# Patient Record
Sex: Male | Born: 1950 | Race: White | Hispanic: No | Marital: Married | Smoking: Never smoker
Health system: Southern US, Community
[De-identification: ages and names within clinical notes are randomized; demographics above are authoritative.]

## PROBLEM LIST (undated history)

## (undated) DIAGNOSIS — E785 Hyperlipidemia, unspecified: Secondary | ICD-10-CM

## (undated) DIAGNOSIS — Q2381 Bicuspid aortic valve: Secondary | ICD-10-CM

## (undated) DIAGNOSIS — I10 Essential (primary) hypertension: Secondary | ICD-10-CM

## (undated) DIAGNOSIS — Z953 Presence of xenogenic heart valve: Secondary | ICD-10-CM

## (undated) DIAGNOSIS — R319 Hematuria, unspecified: Secondary | ICD-10-CM

## (undated) DIAGNOSIS — I350 Nonrheumatic aortic (valve) stenosis: Principal | ICD-10-CM

## (undated) DIAGNOSIS — Q231 Congenital insufficiency of aortic valve: Secondary | ICD-10-CM

## (undated) DIAGNOSIS — G43909 Migraine, unspecified, not intractable, without status migrainosus: Secondary | ICD-10-CM

## (undated) HISTORY — PX: CLOSED MANIPULATION SHOULDER: SUR205

## (undated) HISTORY — PX: CARDIAC CATHETERIZATION: SHX172

## (undated) HISTORY — DX: Nonrheumatic aortic (valve) stenosis: I35.0

## (undated) HISTORY — PX: HIP ARTHROSCOPY: SUR88

## (undated) HISTORY — PX: CYSTOSCOPY: SUR368

## (undated) HISTORY — DX: Bicuspid aortic valve: Q23.81

## (undated) HISTORY — DX: Hyperlipidemia, unspecified: E78.5

## (undated) HISTORY — DX: Congenital insufficiency of aortic valve: Q23.1

## (undated) HISTORY — DX: Essential (primary) hypertension: I10

---

## 2005-10-21 ENCOUNTER — Ambulatory Visit (HOSPITAL_COMMUNITY): Admission: RE | Admit: 2005-10-21 | Discharge: 2005-10-21 | Payer: Self-pay | Admitting: Family Medicine

## 2007-01-18 ENCOUNTER — Ambulatory Visit: Payer: Self-pay

## 2007-01-18 ENCOUNTER — Encounter: Payer: Self-pay | Admitting: Family Medicine

## 2007-05-04 ENCOUNTER — Ambulatory Visit: Payer: Self-pay | Admitting: Cardiology

## 2009-02-11 ENCOUNTER — Telehealth (INDEPENDENT_AMBULATORY_CARE_PROVIDER_SITE_OTHER): Payer: Self-pay | Admitting: *Deleted

## 2009-02-12 ENCOUNTER — Ambulatory Visit (HOSPITAL_COMMUNITY): Admission: RE | Admit: 2009-02-12 | Discharge: 2009-02-12 | Payer: Self-pay | Admitting: Orthopedic Surgery

## 2009-02-13 ENCOUNTER — Encounter: Admission: RE | Admit: 2009-02-13 | Discharge: 2009-03-14 | Payer: Self-pay | Admitting: Orthopedic Surgery

## 2009-07-22 ENCOUNTER — Encounter: Admission: RE | Admit: 2009-07-22 | Discharge: 2009-07-22 | Payer: Self-pay | Admitting: Family Medicine

## 2009-11-13 ENCOUNTER — Ambulatory Visit: Payer: Self-pay | Admitting: Cardiology

## 2009-11-13 DIAGNOSIS — I359 Nonrheumatic aortic valve disorder, unspecified: Secondary | ICD-10-CM | POA: Insufficient documentation

## 2009-11-13 DIAGNOSIS — E785 Hyperlipidemia, unspecified: Secondary | ICD-10-CM | POA: Insufficient documentation

## 2009-11-13 DIAGNOSIS — I10 Essential (primary) hypertension: Secondary | ICD-10-CM | POA: Insufficient documentation

## 2009-12-10 ENCOUNTER — Ambulatory Visit (HOSPITAL_COMMUNITY): Admission: RE | Admit: 2009-12-10 | Discharge: 2009-12-10 | Payer: Self-pay | Admitting: Gastroenterology

## 2010-01-14 ENCOUNTER — Encounter: Payer: Self-pay | Admitting: Cardiology

## 2010-01-14 ENCOUNTER — Ambulatory Visit: Payer: Self-pay | Admitting: Cardiology

## 2010-01-14 ENCOUNTER — Ambulatory Visit (HOSPITAL_COMMUNITY): Admission: RE | Admit: 2010-01-14 | Discharge: 2010-01-14 | Payer: Self-pay | Admitting: Cardiology

## 2010-01-23 ENCOUNTER — Telehealth (INDEPENDENT_AMBULATORY_CARE_PROVIDER_SITE_OTHER): Payer: Self-pay | Admitting: *Deleted

## 2010-06-30 ENCOUNTER — Emergency Department (HOSPITAL_COMMUNITY)
Admission: EM | Admit: 2010-06-30 | Discharge: 2010-07-01 | Payer: Self-pay | Source: Home / Self Care | Admitting: Emergency Medicine

## 2010-07-02 LAB — URINALYSIS, ROUTINE W REFLEX MICROSCOPIC
Bilirubin Urine: NEGATIVE
Ketones, ur: NEGATIVE mg/dL
Leukocytes, UA: NEGATIVE
Nitrite: NEGATIVE
Protein, ur: NEGATIVE mg/dL
Specific Gravity, Urine: 1.014 (ref 1.005–1.030)
Urine Glucose, Fasting: NEGATIVE mg/dL
Urobilinogen, UA: 1 mg/dL (ref 0.0–1.0)
pH: 7.5 (ref 5.0–8.0)

## 2010-07-02 LAB — POCT I-STAT, CHEM 8
BUN: 25 mg/dL — ABNORMAL HIGH (ref 6–23)
Calcium, Ion: 1.22 mmol/L (ref 1.12–1.32)
Chloride: 102 mEq/L (ref 96–112)
Creatinine, Ser: 1.1 mg/dL (ref 0.4–1.5)
Glucose, Bld: 79 mg/dL (ref 70–99)
HCT: 47 % (ref 39.0–52.0)
Hemoglobin: 16 g/dL (ref 13.0–17.0)
Potassium: 4.2 mEq/L (ref 3.5–5.1)
Sodium: 140 mEq/L (ref 135–145)
TCO2: 32 mmol/L (ref 0–100)

## 2010-07-02 LAB — DIFFERENTIAL
Basophils Absolute: 0.1 10*3/uL (ref 0.0–0.1)
Basophils Relative: 1 % (ref 0–1)
Eosinophils Absolute: 0.2 10*3/uL (ref 0.0–0.7)
Eosinophils Relative: 1 % (ref 0–5)
Lymphocytes Relative: 30 % (ref 12–46)
Lymphs Abs: 3.2 10*3/uL (ref 0.7–4.0)
Monocytes Absolute: 0.8 10*3/uL (ref 0.1–1.0)
Monocytes Relative: 8 % (ref 3–12)
Neutro Abs: 6.4 10*3/uL (ref 1.7–7.7)
Neutrophils Relative %: 60 % (ref 43–77)

## 2010-07-02 LAB — CBC
HCT: 43.7 % (ref 39.0–52.0)
Hemoglobin: 15.1 g/dL (ref 13.0–17.0)
MCH: 31.6 pg (ref 26.0–34.0)
MCHC: 34.6 g/dL (ref 30.0–36.0)
MCV: 91.4 fL (ref 78.0–100.0)
Platelets: 209 10*3/uL (ref 150–400)
RBC: 4.78 MIL/uL (ref 4.22–5.81)
RDW: 12.4 % (ref 11.5–15.5)
WBC: 10.6 10*3/uL — ABNORMAL HIGH (ref 4.0–10.5)

## 2010-07-02 LAB — LIPASE, BLOOD: Lipase: 43 U/L (ref 11–59)

## 2010-07-02 LAB — URINE MICROSCOPIC-ADD ON

## 2010-07-02 LAB — HEPATIC FUNCTION PANEL
ALT: 29 U/L (ref 0–53)
AST: 40 U/L — ABNORMAL HIGH (ref 0–37)
Albumin: 3.9 g/dL (ref 3.5–5.2)
Alkaline Phosphatase: 71 U/L (ref 39–117)
Bilirubin, Direct: 0.4 mg/dL — ABNORMAL HIGH (ref 0.0–0.3)
Indirect Bilirubin: 0.3 mg/dL (ref 0.3–0.9)
Total Bilirubin: 0.7 mg/dL (ref 0.3–1.2)
Total Protein: 6.9 g/dL (ref 6.0–8.3)

## 2010-07-15 NOTE — Assessment & Plan Note (Signed)
Summary: [facility]   Visit Type:  Follow-up Primary Provider:  Dr. Vernon Prey  CC:  Aortic Stenosis.  History of Present Illness: The patient presents for follow up of his known aortic stenosis. He has a functionally bicuspid aortic valve with an echocardiogram in 2008. At that time when I last saw him he was asymptomatic. He remains asymptomatic. He exercises including bicycling. With this activity he has not had any chest pressure, neck or arm discomfort. He does not have palpitations, presyncope or syncope. He does not have shortness of breath and denies any PND or orthopnea. He has had no swelling or weight gain.  Current Medications (verified): 1)  Lisinopril 5 Mg Tabs (Lisinopril) .Marland Kitchen.. 1 By Mouth Daily 2)  Flonase 50 Mcg/act Susp (Fluticasone Propionate) .... As Directed 3)  Fish Oil   Oil (Fish Oil) .... 4 By Mouth Daily 4)  Vitamin D 1000 Unit Tabs (Cholecalciferol) .... 2 By Mouth Daily 5)  Multivitamins   Tabs (Multiple Vitamin) .Marland Kitchen.. 1 By Mouth Daily  Allergies (verified): No Known Drug Allergies  Past History:  Past Medical History: 1. Hypertension x16 years. 2. Hyperlipidemia. 3. Bicuspid aortic valve.   Past Surgical History: Reviewed history from 11/13/2009 and no changes required. Arthroscopic hip surgery.   Review of Systems       As stated in the HPI and negative for all other systems.   Vital Signs:  Patient profile:   60 year old male Height:      70 inches Weight:      157 pounds BMI:     22.61 Pulse rate:   65 / minute Resp:     16 per minute BP sitting:   104 / 66  (right arm)  Vitals Entered By: Marrion Coy, CNA (November 13, 2009 2:20 PM)  Physical Exam  General:  Well developed, well nourished, in no acute distress. Head:  normocephalic and atraumatic Neck:  Neck supple, no JVD. No masses, thyromegaly or abnormal cervical nodes, transmitted systolic murmur Chest Wall:  no deformities or breast masses noted Lungs:  Clear bilaterally  to auscultation and percussion. Abdomen:  Bowel sounds positive; abdomen soft and non-tender without masses, organomegaly, or hernias noted. No hepatosplenomegaly. Msk:  Back normal, normal gait. Muscle strength and tone normal. Extremities:  No clubbing or cyanosis. Neurologic:  Alert and oriented x 3. Skin:  Intact without lesions or rashes. Cervical Nodes:  no significant adenopathy Inguinal Nodes:  no significant adenopathy Psych:  Normal affect.   Detailed Cardiovascular Exam  Neck    Carotids: Carotids full and equal bilaterally without bruits.      Neck Veins: Normal, no JVD.    Heart    Inspection: no deformities or lifts noted.      Palpation: normal PMI with no thrills palpable.      Auscultation: S1 and S2 within normal limits, no S3, no S4, no clicks, no rubs, 3/6 mid peaking apical systolic murmur radiating up the aortic outflow tract and into the carotids, no diastolic murmurs.  Vascular    Abdominal Aorta: no palpable masses, pulsations, or audible bruits.      Femoral Pulses: normal femoral pulses bilaterally.      Pedal Pulses: normal pedal pulses bilaterally.      Radial Pulses: normal radial pulses bilaterally.      Peripheral Circulation: no clubbing, cyanosis, or edema noted with normal capillary refill.     EKG  Procedure date:  11/13/2009  Findings:  sinus rhythm, rate 68, axis within normal limits, intervals within normal limits, no acute ST-T wave changes.  Impression & Recommendations:  Problem # 1:  AORTIC VALVE DISEASE (ICD-424.1) I suspect his aortic stenosis is moderate and perhaps slightly progressed. He will get another echocardiogram in August which will be 3 years from the time of his last.  He does remain asymptomatic. Orders: Echocardiogram (Echo) EKG w/ Interpretation (93000)  Problem # 2:  HYPERTENSION (ICD-401.9) Hicatheters blood pressure is well controlled. We will continue on the meds as listed. Orders: Echocardiogram  (Echo) EKG w/ Interpretation (93000)  Problem # 3:  HYPERLIPIDEMIA (ICD-272.4) The patient has had trouble tolerating statins. He is to start trilipix.  I will defer to his primary MD.  Patient Instructions: 1)  Your physician recommends that you schedule a follow-up appointment in: 2 year with Dr [name] 2)  Your physician recommends that you continue on your current medications as directed. Please refer to the Current Medication list given to you today. 3)  Your physician has requested that you have an echocardiogram.  Echocardiography is a painless test that uses sound waves to create images of your heart. It provides your doctor with information about the size and shape of your heart and how well your heart's chambers and valves are working.  This procedure takes approximately one hour. There are no restrictions for this procedure.

## 2010-07-15 NOTE — Progress Notes (Signed)
Summary: Patient concern about staff wearing perfume  Phone Note Call from Patient Call back at Home Phone [phone]   Caller: Patient Summary of Call: While John Boyd echo results were being conveyed to him, he expressed concern about the fact that the staff member who registered him was wearing perfume.  He said that it made him ill for the rest of the day after his echo.  I spoke with the patient and made him aware that our policy oes prohibit staff from wearing perfumes and that the staff would be reminded of the policy.  He voiced appreciation. Initial call taken by: Fabio Neighbors, Site Manager

## 2010-09-20 LAB — COMPREHENSIVE METABOLIC PANEL
ALT: 116 U/L — ABNORMAL HIGH (ref 0–53)
AST: 70 U/L — ABNORMAL HIGH (ref 0–37)
Albumin: 4.2 g/dL (ref 3.5–5.2)
Alkaline Phosphatase: 71 U/L (ref 39–117)
BUN: 14 mg/dL (ref 6–23)
CO2: 28 mEq/L (ref 19–32)
Calcium: 9.8 mg/dL (ref 8.4–10.5)
Chloride: 104 mEq/L (ref 96–112)
Creatinine, Ser: 0.89 mg/dL (ref 0.4–1.5)
GFR calc Af Amer: >60 mL/min (ref >60)
GFR calc non Af Amer: >60 mL/min (ref >60)
Glucose, Bld: 90 mg/dL (ref 70–99)
Potassium: 4.5 mEq/L (ref 3.5–5.1)
Sodium: 140 mEq/L (ref 135–145)
Total Bilirubin: 0.5 mg/dL (ref 0.3–1.2)
Total Protein: 7.2 g/dL (ref 6.0–8.3)

## 2010-09-20 LAB — CBC
HCT: 43.7 % (ref 39.0–52.0)
Hemoglobin: 15.3 g/dL (ref 13.0–17.0)
MCHC: 34.9 g/dL (ref 30.0–36.0)
MCV: 93.9 fL (ref 78.0–100.0)
Platelets: 204 10*3/uL (ref 150–400)
RBC: 4.66 MIL/uL (ref 4.22–5.81)
RDW: 12.7 % (ref 11.5–15.5)
WBC: 7.6 10*3/uL (ref 4.0–10.5)

## 2010-09-20 LAB — URINALYSIS, ROUTINE W REFLEX MICROSCOPIC
Bilirubin Urine: NEGATIVE
Glucose, UA: NEGATIVE mg/dL
Ketones, ur: NEGATIVE mg/dL
Leukocytes, UA: NEGATIVE
Nitrite: NEGATIVE
Protein, ur: NEGATIVE mg/dL
Specific Gravity, Urine: 1.005 (ref 1.005–1.030)
Urobilinogen, UA: 0.2 mg/dL (ref 0.0–1.0)
pH: 6.5 (ref 5.0–8.0)

## 2010-09-20 LAB — URINE MICROSCOPIC-ADD ON

## 2010-09-20 LAB — DIFFERENTIAL
Basophils Absolute: 0 10*3/uL (ref 0.0–0.1)
Basophils Relative: 1 % (ref 0–1)
Eosinophils Absolute: 0.2 10*3/uL (ref 0.0–0.7)
Eosinophils Relative: 2 % (ref 0–5)
Lymphocytes Relative: 34 % (ref 12–46)
Lymphs Abs: 2.6 10*3/uL (ref 0.7–4.0)
Monocytes Absolute: 0.6 10*3/uL (ref 0.1–1.0)
Monocytes Relative: 9 % (ref 3–12)
Neutro Abs: 4.2 10*3/uL (ref 1.7–7.7)
Neutrophils Relative %: 55 % (ref 43–77)

## 2010-09-20 LAB — APTT: aPTT: 29 seconds (ref 24–37)

## 2010-09-20 LAB — PROTIME-INR
INR: 1 (ref 0.00–1.49)
Prothrombin Time: 12.6 seconds (ref 11.6–15.2)

## 2010-10-28 NOTE — Op Note (Signed)
NAMEWILLIAMS, John Boyd              ACCOUNT NO.:  [id number]   MEDICAL RECORD NO.:  [id number]          PATIENT TYPE:  AMB   LOCATION:  DAY                          FACILITY:  [facility]   PHYSICIAN:  Ronald A. Gioffre, M.D.DATE OF BIRTH:  1950-10-09   DATE OF PROCEDURE:  02/12/2009  DATE OF DISCHARGE:                               OPERATIVE REPORT   SURGEON:  Georges Lynch. Gioffre, M.D.   ASSISTANT:  None.   PREOPERATIVE DIAGNOSES:  1. Severe frozen shoulder, right shoulder.  2. Subdeltoid bursitis, right shoulder.   POSTOPERATIVE DIAGNOSES:  1. Severe frozen shoulder, right shoulder.  2. Subdeltoid bursitis, right shoulder.   OPERATION:  1. Closed manipulation of the right shoulder.  2. Injection right subdeltoid bursa with 1 mL Depo-Medrol, 5 mL of      0.5% Marcaine.   DESCRIPTION OF PROCEDURE:  Under general anesthesia routine prep of the  shoulder was carried out and did a closed manipulation and gently this  manipulation was carried out in several different phase.  This was a  gentle closed manipulation.  I was able to easily get his arm above his  head and corrected most of his external rotation, internal rotation.  I  then did the prep and injected the shoulder with 5 mL of 0.5% Marcaine  with 1 mL Depo-Medrol.  The patient will be started on therapy the  following day.           ______________________________  Georges Lynch. Darrelyn Hillock, M.D.     RAG/MEDQ  D:  02/12/2009  T:  02/12/2009  Job:  [id number]

## 2010-10-28 NOTE — Assessment & Plan Note (Signed)
[facility]                            CARDIOLOGY OFFICE NOTE   NAME:John Boyd, John Boyd                     MRN:          [id number]  DATE:05/04/2007                            DOB:          11/14/1950    REASON FOR CONSULTATION:  Evaluate patient with an abnormal exercise  treadmill test and bicuspid aortic valve.   HISTORY OF PRESENT ILLNESS:  The patient is a pleasant 60 year old  gentleman who was told in the 1990s that he probably had a bicuspid  aortic valve.  He has had followup echocardiograms every 4 or 5 years.  The last one was in our office in August of 2008.  At that time, he had  an ejection fraction of 60% to 65%.  The estimated aortic valve area is  1.21.  The mean gradient was 12.  This was mild aortic valve stenosis  with trivial aortic regurgitation.  It appeared to be a bicuspid valve.  He most recently had an exercise treadmill test.  He had an appropriate  blood pressure and heart rate response.  There was some ST segment  depression and peak exercise; however, I do not see this consistently in  consecutive leads from beat to beat.  There is some artifact.  He did  not have any symptoms of chest discomfort.  There were no arrhythmias.   The patient is quite active.  He exercises routinely.  He denies any  chest discomfort, neck discomfort, arm discomfort, activity-induced  nausea or vomiting, excessive diaphoresis.  He does not have any  palpitations, presyncope or syncope.  He has had no PND or orthopnea.   PAST MEDICAL HISTORY:  1. Hypertension x14 years.  2. Hyperlipidemia.  3. Bicuspid aortic valve.   PAST SURGICAL HISTORY:  Arthroscopic hip surgery.   ALLERGIES:  None.   MEDICATIONS:  1. Lisinopril 10 mg daily.  2. Nasal spray.  3. Niaspan 500 mg daily.  4. Crestor 10 mg a day.  5. Aspirin.  6. Multivitamin.  7. Loratadine.  8. Mucinex.   SOCIAL HISTORY:  The patient is a Optician, dispensing.  He is married.  He has 2  children.  He never smoked cigarettes and does not drink alcohol.   FAMILY HISTORY:  Noncontributory for early coronary artery disease,  congenital heart disease, sudden death.   REVIEW OF SYSTEMS:  As stated in the HPI and otherwise positive for  reflux, negative for other systems.   PHYSICAL EXAMINATION:  The patient is in no distress.  Blood pressure 116/78, heart rate 61 and regular.  HEENT:  Eyelids unremarkable, pupils are equal, round, and reactive to  light, fundi not visualized.  Oral mucosa unremarkable.  NECK:  No jugular venous distension at 45 degrees, carotid upstroke  brisk and symmetrical.  No bruit.  No thyromegaly.  LYMPHATICS:  No cervical, axillary, inguinal adenopathy.  LUNGS:  Clear to auscultation bilaterally.  BACK:  No costovertebral angle tenderness.  CHEST:  Unremarkable.  HEART:  PMI not displaced or sustained.  S1 and S2 are within normal  limits.  No S3, no S4.  A 3/6 apical systolic  murmur radiating at the  aortic outflow track.  No diastolic murmurs.  ABDOMEN:  Flat, positive bowel sounds, normal in frequency and pitch.  No bruits, no rebound, no guarding.  No midline pulsatile mass.  No  hepatomegaly, no splenomegaly.  SKIN:  No rashes, no nodules.  EXTREMITIES:  2+ pulses throughout, no edema.  No cyanosis, no clubbing.  NEURO:  Oriented to person, place, and time.  Cranial nerves II-XII  grossly intact, motor grossly intact.   EKG sinus rhythm, rate 61, axis within normal limits, intervals within  normal limits.  No acute ST-T wave changes.   ASSESSMENT AND PLAN:  1. Abnormal exercise treadmill test:  I did review this in detail.      The patient has no symptoms.  He is quite active.  He has some risk      factors though they are well controlled.  There is some, what I      believe to be, relatively nonspecific findings on the treadmill      with an appropriate heart rate and blood pressure response.      Therefore, I would not interpret this  as high probability for      coronary disease, and would not suggest further cardiovascular      testing.  He should continue with primary risk reduction.  2. Bicuspid aortic valve.  I did review this, reviewing a heart model      with him in detail.  I think he ought to be followed every couple      of years in this clinic.  It is mild currently and he is not having      any symptoms.  I did tell him that new guidelines do not suggest      that he needs endocarditis prophylaxis.  3. Hypertension:  Blood pressure is well controlled and he will      continue medications as listed.  4. Dyslipidemia:  Per Dr. Christell Constant, and he is having aggressive      management.  5. Followup:  I will see him back in 24 months or sooner if needed.     [name], MD, [facility]  Electronically Signed    JH/MedQ  DD: 05/04/2007  DT: 05/04/2007  Job #: [id number]   cc:   Ernestina Penna, M.D.

## 2011-01-20 ENCOUNTER — Encounter: Payer: Self-pay | Admitting: Cardiology

## 2012-03-09 ENCOUNTER — Telehealth: Payer: Self-pay | Admitting: Cardiology

## 2012-03-09 DIAGNOSIS — I359 Nonrheumatic aortic valve disorder, unspecified: Secondary | ICD-10-CM

## 2012-03-09 NOTE — Telephone Encounter (Signed)
Will forward to DrHochrein for review and recommendations for letter.  Pt has not been seen in office, only in the hosp 8/11.

## 2012-03-09 NOTE — Telephone Encounter (Signed)
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 AttGreggory Keen [employer] [phone], pls call [phone] and mail copy to pt

## 2012-03-10 NOTE — Telephone Encounter (Signed)
He needs to be seen back in the office with an echo before we can right any letter.  He could have the echo same day.

## 2012-03-11 ENCOUNTER — Telehealth: Payer: Self-pay | Admitting: *Deleted

## 2012-03-11 NOTE — Telephone Encounter (Signed)
Left message for Mr. Defelice to call and schedule echo and office.

## 2012-03-14 NOTE — Telephone Encounter (Signed)
Spoke with Mr. Mixell today. He has decided not schedule Echo/OV with Dr. [name] at this time. Patient has moved to [facility], and has decided to contact his PCP to see if he will write him a letter. Mr. Mcright stated he may have to call back if his PCP decides he needs to see his Cardiologist. He will call us back if anything changes. I will defer the Echocardiogram order until we hear from Mr. You.

## 2012-03-18 ENCOUNTER — Other Ambulatory Visit (HOSPITAL_COMMUNITY): Payer: Self-pay

## 2012-03-18 ENCOUNTER — Ambulatory Visit (HOSPITAL_COMMUNITY): Payer: BC Managed Care – PPO | Attending: Cardiology

## 2012-03-18 DIAGNOSIS — I080 Rheumatic disorders of both mitral and aortic valves: Secondary | ICD-10-CM | POA: Insufficient documentation

## 2012-03-18 DIAGNOSIS — I369 Nonrheumatic tricuspid valve disorder, unspecified: Secondary | ICD-10-CM | POA: Insufficient documentation

## 2012-03-18 DIAGNOSIS — I359 Nonrheumatic aortic valve disorder, unspecified: Secondary | ICD-10-CM

## 2012-03-18 DIAGNOSIS — I10 Essential (primary) hypertension: Secondary | ICD-10-CM | POA: Insufficient documentation

## 2012-03-18 NOTE — Progress Notes (Signed)
Echocardiogram performed.  

## 2012-03-22 ENCOUNTER — Encounter: Payer: Self-pay | Admitting: Cardiology

## 2012-03-22 ENCOUNTER — Encounter: Payer: Self-pay | Admitting: *Deleted

## 2012-03-22 ENCOUNTER — Ambulatory Visit (INDEPENDENT_AMBULATORY_CARE_PROVIDER_SITE_OTHER): Payer: BC Managed Care – PPO | Admitting: Cardiology

## 2012-03-22 VITALS — BP 116/78 | HR 56 | Ht 70.0 in | Wt 165.0 lb

## 2012-03-22 DIAGNOSIS — E785 Hyperlipidemia, unspecified: Secondary | ICD-10-CM

## 2012-03-22 DIAGNOSIS — I10 Essential (primary) hypertension: Secondary | ICD-10-CM

## 2012-03-22 DIAGNOSIS — I359 Nonrheumatic aortic valve disorder, unspecified: Secondary | ICD-10-CM

## 2012-03-22 NOTE — Patient Instructions (Addendum)
Your physician recommends that you schedule a follow-up appointment in: February WITH DR [facility]  Your physician has requested that you have an echocardiogram. Echocardiography is a painless test that uses sound waves to create images of your heart. It provides your doctor with information about the size and shape of your heart and how well your heart's chambers and valves are working. This procedure takes approximately one hour. There are no restrictions for this procedure. SCHEDULE SAMEDAY AS APPT WITH DR [facility]

## 2012-03-22 NOTE — Progress Notes (Signed)
HPI The patient presents for followup of aortic stenosis. I last saw him 2 years ago which time echo suggested a mean gradient of 19 with mild aortic stenosis. 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 I sent him for another echocardiogram which now suggested severe aortic stenosis with a mean gradient of 42. He has preserved left ventricular function. He does report that he's had some very mild dyspnea when running with his grandchildren such as playing sports. He says that he dug a post holes this weekend without developing any shortness of breath. He's not describing any chest pressure, neck or arm discomfort. He's had no palpitations, presyncope or syncope. He has had no PND or orthopnea.  No Known Allergies  Current Outpatient Prescriptions  Medication Sig Dispense Refill  . aspirin 81 MG tablet Take 81 mg by mouth daily.      . Cholecalciferol 1000 UNITS tablet Take 2,000 Units by mouth daily.        . Fish Oil OIL Take 4 tablets by mouth daily.        . fluticasone (FLONASE) 50 MCG/ACT nasal spray Place into the nose as directed.        Marland Kitchen lisinopril (PRINIVIL,ZESTRIL) 5 MG tablet Take 10 mg by mouth daily.       . multivitamin (THERAGRAN) per tablet Take 1 tablet by mouth daily.          Past Medical History  Diagnosis Date  . Hypertension     x16 years  . Hyperlipidemia   . Bicuspid aortic valve     Past Surgical History  Procedure Date  . Hip arthroscopy     ROS:  As stated in the HPI and negative for all other systems.  PHYSICAL EXAM BP 116/78  Pulse 56  Ht 5\' 10"  (1.778 m)  Wt 74.844 kg (165 lb)  BMI 23.68 kg/m2 GENERAL:  Well appearing HEENT:  Pupils equal round and reactive, fundi not visualized, oral mucosa unremarkable NECK:  No jugular venous distention, waveform within normal limits, carotid upstroke mildly reduced and symmetric, no bruits, no thyromegaly LYMPHATICS:  No cervical, inguinal adenopathy LUNGS:  Clear to  auscultation bilaterally BACK:  No CVA tenderness CHEST:  Unremarkable HEART:  PMI not displaced or sustained,S1 and S2 within normal limits, no S3, no S4, no clicks, no rubs, 3/6 apical systolic murmur mid to late peaking with radiation to the carotids murmurs ABD:  Flat, positive bowel sounds normal in frequency in pitch, no bruits, no rebound, no guarding, no midline pulsatile mass, no hepatomegaly, no splenomegaly EXT:  2 plus pulses throughout, no edema, no cyanosis no clubbing SKIN:  No rashes no nodules NEURO:  Cranial nerves II through XII grossly intact, motor grossly intact throughout PSYCH:  Cognitively intact, oriented to person place and time   EKG:  Sinus rhythm, rate 56, axis within normal limits, intervals within normal limits, no acute ST-T wave changes. 03/22/2012  ASSESSMENT AND PLAN  AORTIC VALVE DISEASE His aortic stenosis has progressed very much by echocardiography in the last 2 years. However, he denies any significant symptoms. At this point I plan on bringing him back in February for repeat echocardiography to see if there is a significant change in the valve gradient in that time frame. I suspect that he will need valve replacement however in the next 6 months or so unless he remains completely asymptomatic or has no significant change in gradient.  We  had a long discussion about this today.  (Greater than 30 minutes reviewing all data with greater than 50% face to face with the patient).  HYPERTENSION His blood pressure is controlled. He will continue the meds as listed.  HYPERLIPIDEMIA  The patient has had trouble tolerating statins. He will continue with diet and exercise.

## 2012-03-28 ENCOUNTER — Ambulatory Visit: Payer: BC Managed Care – PPO | Admitting: Cardiology

## 2012-04-09 ENCOUNTER — Encounter: Payer: Self-pay | Admitting: Cardiology

## 2012-04-11 NOTE — Telephone Encounter (Signed)
Dr [name] is not in the office this week. I will forward this to him for review when he returns next week.

## 2012-04-22 ENCOUNTER — Telehealth: Payer: Self-pay | Admitting: Cardiology

## 2012-04-22 NOTE — Telephone Encounter (Signed)
Pt would like a response to the question that was sent in by the my chart system

## 2012-04-22 NOTE — Telephone Encounter (Signed)
Patient's wife's phone # given [phone] is disconnected. Phone called was placed to pt's home. Left  a message to pt or wife to call back regarding my chart note send to Dr. [name] and nurse on 04/09/12.

## 2012-04-25 NOTE — Telephone Encounter (Signed)
New problem:   Was told to call the [facility] office . Husband had sent an e-mail on 10/26.

## 2012-04-25 NOTE — Telephone Encounter (Signed)
F/u   Status of email that was sent to Dr. [name].

## 2012-04-25 NOTE — Telephone Encounter (Signed)
Will forward to Dr [name] to review notes from My Chart the pt sent in

## 2012-04-27 ENCOUNTER — Telehealth: Payer: Self-pay | Admitting: Cardiology

## 2012-04-27 ENCOUNTER — Encounter: Payer: Self-pay | Admitting: *Deleted

## 2012-04-27 NOTE — Telephone Encounter (Signed)
Pt needs heart cath per Dr [name]

## 2012-04-27 NOTE — Telephone Encounter (Signed)
Message copied by Sharin Grave on Wed Apr 27, 2012 11:12 AM ------      Message from: [name]      Created: Mon Apr 25, 2012  5:23 PM       Needs a right and left heart cath by me in the [facility] lab.

## 2012-04-27 NOTE — Telephone Encounter (Signed)
Pt to be scheduled

## 2012-04-27 NOTE — Telephone Encounter (Signed)
plz return call to pt [phone] or [phone], to schedule heart cath.  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 plz return call to schedule heart cath ASAP.

## 2012-04-28 ENCOUNTER — Encounter: Payer: Self-pay | Admitting: Cardiology

## 2012-04-29 ENCOUNTER — Encounter: Payer: Self-pay | Admitting: Cardiology

## 2012-04-29 NOTE — Telephone Encounter (Signed)
Pt scheduled for cath 11/21 with Dr [name]  He is aware.  Instructions have been mailed.  He is having lab work at [facility] in [place].

## 2012-05-02 ENCOUNTER — Telehealth: Payer: Self-pay | Admitting: *Deleted

## 2012-05-02 NOTE — Telephone Encounter (Signed)
Will discuss pt's request with Dr [name].  Pt took himself out of work last week and wants a note.  Pam,  Thank you for your help yesterday. In addition to the instructions for my heart cath, I also need a letter from your office documenting my need for medical leave which I can provide to my employer. Although my appointment is not until next Thursday, I took leave this past Tuesday. 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 In addition, the physical tasks involved in preparing the truck for trips-closing the large doors on the trailer, cranking the landing gear on the trailer up and down, walking across large parking lots, etc.-leave me winded and exhausted.  Thank you for your help on this matter. You can send the letter to me and I will forward it to my employer. If you prefer to send it directly to [employer], you can mail it to [address], [place], [place] [address], ATTN: Transport planner. If you send the letter directly to the company, please send a copy to me by mail or by email.  Daiva Eves

## 2012-05-03 NOTE — Telephone Encounter (Signed)
Spoke with pt. He needs work letter sent to his company by November 26,2013. (address is in email message). I spoke with pt and verbally reviewed all cath instructions with him.  I told him he should be receiving these instructions in the mail soon.

## 2012-05-03 NOTE — Telephone Encounter (Signed)
F/u   In addition to the note requested,  Pt needs instructions for upcoming catherization on 05/05/12. plz return call to pt at [phone].

## 2012-05-04 ENCOUNTER — Other Ambulatory Visit: Payer: Self-pay | Admitting: Cardiology

## 2012-05-04 DIAGNOSIS — I350 Nonrheumatic aortic (valve) stenosis: Secondary | ICD-10-CM

## 2012-05-05 ENCOUNTER — Inpatient Hospital Stay (HOSPITAL_COMMUNITY)
Admit: 2012-05-05 | Discharge: 2012-05-05 | Disposition: A | Discharge status: Home / Self Care | Payer: BC Managed Care – PPO | Attending: Neurology | Admitting: Neurology

## 2012-05-05 ENCOUNTER — Encounter (HOSPITAL_BASED_OUTPATIENT_CLINIC_OR_DEPARTMENT_OTHER)
Admission: RE | Disposition: A | Discharge status: Home / Self Care | Payer: Self-pay | Source: Ambulatory Visit | Attending: Cardiology

## 2012-05-05 ENCOUNTER — Inpatient Hospital Stay (HOSPITAL_BASED_OUTPATIENT_CLINIC_OR_DEPARTMENT_OTHER)
Admission: RE | Admit: 2012-05-05 | Discharge: 2012-05-05 | Disposition: A | Discharge status: Home / Self Care | Payer: BC Managed Care – PPO | Source: Ambulatory Visit | Attending: Cardiology | Admitting: Cardiology

## 2012-05-05 ENCOUNTER — Encounter (HOSPITAL_BASED_OUTPATIENT_CLINIC_OR_DEPARTMENT_OTHER): Payer: Self-pay | Admitting: Neurology

## 2012-05-05 DIAGNOSIS — I350 Nonrheumatic aortic (valve) stenosis: Secondary | ICD-10-CM

## 2012-05-05 DIAGNOSIS — R0609 Other forms of dyspnea: Secondary | ICD-10-CM | POA: Insufficient documentation

## 2012-05-05 DIAGNOSIS — R0989 Other specified symptoms and signs involving the circulatory and respiratory systems: Secondary | ICD-10-CM | POA: Insufficient documentation

## 2012-05-05 DIAGNOSIS — Q231 Congenital insufficiency of aortic valve: Secondary | ICD-10-CM | POA: Insufficient documentation

## 2012-05-05 DIAGNOSIS — H538 Other visual disturbances: Secondary | ICD-10-CM | POA: Insufficient documentation

## 2012-05-05 DIAGNOSIS — H5340 Unspecified visual field defects: Secondary | ICD-10-CM | POA: Diagnosis not present

## 2012-05-05 DIAGNOSIS — I359 Nonrheumatic aortic valve disorder, unspecified: Secondary | ICD-10-CM

## 2012-05-05 DIAGNOSIS — Z7982 Long term (current) use of aspirin: Secondary | ICD-10-CM | POA: Insufficient documentation

## 2012-05-05 DIAGNOSIS — G43109 Migraine with aura, not intractable, without status migrainosus: Secondary | ICD-10-CM | POA: Diagnosis not present

## 2012-05-05 DIAGNOSIS — Z79899 Other long term (current) drug therapy: Secondary | ICD-10-CM | POA: Insufficient documentation

## 2012-05-05 DIAGNOSIS — E7800 Pure hypercholesterolemia, unspecified: Secondary | ICD-10-CM | POA: Insufficient documentation

## 2012-05-05 DIAGNOSIS — I10 Essential (primary) hypertension: Secondary | ICD-10-CM | POA: Insufficient documentation

## 2012-05-05 HISTORY — DX: Migraine, unspecified, not intractable, without status migrainosus: G43.909

## 2012-05-05 LAB — POCT I-STAT 3, ART BLOOD GAS (G3+)
TCO2: 26 mmol/L (ref 0–100)
pCO2 arterial: 39.4 mmHg (ref 35.0–45.0)
pH, Arterial: 7.404 (ref 7.350–7.450)

## 2012-05-05 LAB — POCT I-STAT 3, VENOUS BLOOD GAS (G3P V)
Acid-base deficit: 3 mmol/L — ABNORMAL HIGH (ref 0.0–2.0)
pO2, Ven: 39 mmHg (ref 30.0–45.0)

## 2012-05-05 SURGERY — JV LEFT AND RIGHT HEART CATHETERIZATION WITH CORONARY ANGIOGRAM
Anesthesia: Moderate Sedation

## 2012-05-05 MED ORDER — SODIUM CHLORIDE 0.9 % IV SOLN
INTRAVENOUS | Status: DC
  Administered 2012-05-05: 10:00:00 via INTRAVENOUS

## 2012-05-05 MED ORDER — SODIUM CHLORIDE 0.9 % IJ SOLN: 3.0000 mL | INTRAMUSCULAR | Status: DC | PRN

## 2012-05-05 MED ORDER — SODIUM CHLORIDE 0.9 % IJ SOLN: 3.0000 mL | Freq: Two times a day (BID) | INTRAMUSCULAR | Status: DC

## 2012-05-05 MED ORDER — SODIUM CHLORIDE 0.9 % IV SOLN
250.0000 mL | INTRAVENOUS | Status: DC | PRN
Start: 2012-05-05 — End: 2012-05-05

## 2012-05-05 MED ORDER — ASPIRIN 81 MG PO CHEW
324.0000 mg | CHEWABLE_TABLET | ORAL | Status: AC
  Administered 2012-05-05: 324 mg via ORAL

## 2012-05-05 NOTE — CV Procedure (Signed)
   Cardiac Catheterization Procedure Note  Name: John Boyd MRN: [id number] DOB: 07-28-1950  Procedure: Right Heart Cath, Left Heart Cath, Selective Coronary Angiography, LV angiography  Indication:  Aortic stenosis.  Procedural Details: The right groin was prepped, draped, and anesthetized with 1% lidocaine. Using the modified Seldinger technique a 5 French sheath was placed in the right femoral artery and a 7 French sheath was placed in the right femoral vein. A Swan-Ganz catheter was used for the right heart catheterization. Standard protocol was followed for recording of right heart pressures and sampling of oxygen saturations. Fick cardiac output was calculated. Standard Judkins catheters were used for selective coronary angiography and left ventriculography. There were no immediate procedural complications. The patient was transferred to the post catheterization recovery area for further monitoring.  Procedural Findings:  Hemodynamics:               RA 1    RV 24/5    PA 15/4  Mean 9    PCWP 3    LV 146/15    AO 103/86    AO Valve area  0.6    AO Gradient mean  47.93   Oxygen saturations:    PA 70    AO 97   Cardiac Output (Fick) 4.1                               Cardiac Index (Fick) 2.1   Coronary angiography:    Coronary dominance: right  Left mainstem: Normal  Left anterior descending (LAD):  Large and wrapping the apex.  Normal.  Large D1 normal.    Left circumflex (LCx): AV groove normal.  RI moderate sized and normal.  OM large and normal.  Right coronary artery (RCA): Large and dominant.  PDA is moderate sized and normal.  PL x 2 are small to moderate sized and normal.  Left ventriculography: Left ventricular systolic function is normal, LVEF is estimated at 55-65%, there is no significant mitral regurgitation   Final Conclusions:  Severe aortic stenosis.  Normal coronaries and LV function.  Recommendations: The patient will be referred for AVR.  Of  note he complained of a possible visual field defect immediately after the procedure.  There were no other neurologic deficits and the visual complaint did seem to improve over a few minutes.  I called the neurology service who is evaluating the patient for possible embolic CVA.     [name] 05/05/2012, 11:16 AM

## 2012-05-05 NOTE — OR Nursing (Signed)
Tegaderm dressing and pressure dressing applied, site level 0, bedrest begins at 1415

## 2012-05-05 NOTE — Progress Notes (Signed)
Right femoral arterial and venous sheaths removed by Venda Rodes.

## 2012-05-05 NOTE — Progress Notes (Signed)
Bedrest begins @ 1400, tegaderm dressing applied by Venda Rodes, site level 0.

## 2012-05-05 NOTE — H&P (Signed)
[name], MD 03/22/2012 7:56 PM Signed  HPI  The patient presents for followup of aortic stenosis. I last saw him 2 years ago which time echo suggested a mean gradient of 19 with mild aortic stenosis. 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 I sent him for another echocardiogram which now suggested severe aortic stenosis with a mean gradient of 42. He has preserved left ventricular function. Since that visit he has described progressive symptoms of dyspnea with his usual activities.  He also described some chest discomfort with activity such as hand cranking the landing gear on a truck.    No Known Allergies  Current Outpatient Prescriptions   Medication  Sig  Dispense  Refill   .  aspirin 81 MG tablet  Take 81 mg by mouth daily.     .  Cholecalciferol 1000 UNITS tablet  Take 2,000 Units by mouth daily.     .  Fish Oil OIL  Take 4 tablets by mouth daily.     .  fluticasone (FLONASE) 50 MCG/ACT nasal spray  Place into the nose as directed.     Marland Kitchen  lisinopril (PRINIVIL,ZESTRIL) 5 MG tablet  Take 10 mg by mouth daily.     .  multivitamin (THERAGRAN) per tablet  Take 1 tablet by mouth daily.      Past Medical History   Diagnosis  Date   .  Hypertension      x16 years   .  Hyperlipidemia    .  Bicuspid aortic valve     Past Surgical History   Procedure  Date   .  Hip arthroscopy     ROS: As stated in the HPI and negative for all other systems.  PHYSICAL EXAM  BP 116/78  Pulse 56  Ht 5\' 10"  (1.778 m)  Wt 74.844 kg (165 lb)  BMI 23.68 kg/m2  GENERAL: Well appearing  HEENT: Pupils equal round and reactive, fundi not visualized, oral mucosa unremarkable  NECK: No jugular venous distention, waveform within normal limits, carotid upstroke mildly reduced and symmetric, no bruits, no thyromegaly  LYMPHATICS: No cervical, inguinal adenopathy  LUNGS: Clear to auscultation bilaterally  BACK: No CVA tenderness  CHEST: Unremarkable  HEART: PMI not  displaced or sustained,S1 and S2 within normal limits, no S3, no S4, no clicks, no rubs, 3/6 apical systolic murmur mid to late peaking with radiation to the carotids murmurs  ABD: Flat, positive bowel sounds normal in frequency in pitch, no bruits, no rebound, no guarding, no midline pulsatile mass, no hepatomegaly, no splenomegaly  EXT: 2 plus pulses throughout, no edema, no cyanosis no clubbing  SKIN: No rashes no nodules  NEURO: Cranial nerves II through XII grossly intact, motor grossly intact throughout  PSYCH: Cognitively intact, oriented to person place and time  EKG: Sinus rhythm, rate 56, axis within normal limits, intervals within normal limits, no acute ST-T wave changes. 03/22/2012  ASSESSMENT AND PLAN  AORTIC VALVE DISEASE  His aortic stenosis has progressed very much by echocardiography in the last 2 years. However, he denies any significant symptoms. At this point I plan on bringing him back in February for repeat echocardiography to see if there is a significant change in the valve gradient in that time frame. I suspect that he will need valve replacement however in the next 6 months or so unless he remains completely asymptomatic or has no significant change in gradient. We had a long  discussion about this today. (Greater than 30 minutes reviewing all data with greater than 50% face to face with the patient).  HYPERTENSION  His blood pressure is controlled. He will continue the meds as listed.  HYPERLIPIDEMIA  The patient has had trouble tolerating statins. He will continue with diet and exercise.   A/P  AORTIC VALVE DISEASE  His aortic stenosis has progressed very much by echocardiography in the last 2 years. He now has symptoms likely related to this.  He will be referred for AVR.  I will perform a right and left heart cath first.  The patient understands that risks included but are not limited to stroke (1 in 1000), death (1 in 1000), kidney failure [usually temporary] (1 in  500), bleeding (1 in 200), allergic reaction [possibly serious] (1 in 200).  The patient understands and agrees to proceed.   HYPERTENSION  His blood pressure is controlled. He will continue the meds as listed.   HYPERLIPIDEMIA  The patient has had trouble tolerating statins. He will continue with diet and exercise.

## 2012-05-05 NOTE — Consult Note (Signed)
NEURO HOSPITALIST CONSULT NOTE    Reason for Consult: visual aura post cardiac catheterizaion  HPI:                                                                                                                                          John Boyd is an 61 y.o. male who states he has history of visual migraines in the past in which he will get scotoma and flashing lights.  His migraines have never been associated with HA and often times brought on by olfactory stimulation such as "markers or perfume".  He has not had a migraine in multiple years.  Today patient was obtaining a right and left heart cardiac catheterization for clearance prior to AVR.  Patient states just after the dye was injected he noted "wavy lines, some bright and some dark, in his left superior visual field.  Very much like he gets with his visual migraines."  He did cover each eye and noted it was only present in the left eye. At time of my exam--(5-8 minutes later)-- his symptoms had all but resolved with only a slight wavy line appearance in his left upper visual quadrant.  During my exam his symptoms had fully resolved. 10 minutes after my exam his wife came to me and noted he was having wavy lines but this time he stated it was only in his right eye and in the right inferior lateral quadrant. Again this cleared in 5 minutes.  A STAT CT head was ordered.  Neurological exam was otherwise normal.   Past Medical History  Diagnosis Date  . Hypertension     x16 years  . Hyperlipidemia   . Bicuspid aortic valve   . Migraine     Past Surgical History  Procedure Date  . Hip arthroscopy     Family History  Problem Relation Age of Onset  . Coronary artery disease Neg Hx     Early  . Heart disease Neg Hx     Congenital  . Sudden death Neg Hx      Social History:  reports that he has never smoked. He does not have any smokeless tobacco history on file. He reports that he does not drink alcohol.  His drug history not on file.  No Known Allergies  MEDICATIONS:  Prior to Admission:  Prescriptions prior to admission  Medication Sig Dispense Refill  . aspirin 81 MG tablet Take 81 mg by mouth daily.      . Cholecalciferol 1000 UNITS tablet Take 2,000 Units by mouth daily.        . Fish Oil OIL Take 4 tablets by mouth daily.        . fluticasone (FLONASE) 50 MCG/ACT nasal spray Place into the nose as directed.        Marland Kitchen lisinopril (PRINIVIL,ZESTRIL) 5 MG tablet Take 10 mg by mouth daily.       . multivitamin (THERAGRAN) per tablet Take 1 tablet by mouth daily.         Scheduled:   . [COMPLETED] aspirin  324 mg Oral Pre-Cath  . [DISCONTINUED] sodium chloride  3 mL Intravenous Q12H     ROS:                                                                                                                                       History obtained from the patient  General ROS: negative for - chills, fatigue, fever, night sweats, weight gain or weight loss Psychological ROS: negative for - behavioral disorder, hallucinations, memory difficulties, mood swings or suicidal ideation Ophthalmic ROS: Positive for - scotoma and blurry vision associated with migraine ENT ROS: negative for - epistaxis, nasal discharge, oral lesions, sore throat, tinnitus or vertigo Allergy and Immunology ROS: negative for - hives or itchy/watery eyes Hematological and Lymphatic ROS: negative for - bleeding problems, bruising or swollen lymph nodes Endocrine ROS: negative for - galactorrhea, hair pattern changes, polydipsia/polyuria or temperature intolerance Respiratory ROS: negative for - cough, hemoptysis, shortness of breath or wheezing Cardiovascular ROS: positive for - dyspnea on exertion Gastrointestinal ROS: negative for - abdominal pain, diarrhea, hematemesis, nausea/vomiting or stool  incontinence Genito-Urinary ROS: negative for - dysuria, hematuria, incontinence or urinary frequency/urgency Musculoskeletal ROS: negative for - joint swelling or muscular weakness Neurological ROS: as noted in HPI Dermatological ROS: negative for rash and skin lesion changes   Blood pressure 119/78, pulse 61, resp. rate 16, height 5\' 10"  (1.778 m), weight 74.844 kg (165 lb), SpO2 99.00%.   Neurologic Examination:                                                                                                      Mental Status: Alert, oriented, thought content appropriate.  Speech fluent without evidence of aphasia.  Able to follow 3 step  commands without difficulty. Cranial Nerves: II: Discs flat bilaterally; Visual fields grossly normal -able to count fingers in all visual fields, pupils equal, round, reactive to light and accommodation. Patient is stating he notes some wavy lines intermittently as noted above III,IV, VI: ptosis not present, extra-ocular motions intact bilaterally V,VII: smile symmetric, facial light touch sensation normal bilaterally VIII: hearing normal bilaterally IX,X: gag reflex present XI: bilateral shoulder shrug XII: midline tongue extension Motor: Right : Upper extremity   5/5    Left:     Upper extremity   5/5  Lower extremity   5/5     Lower extremity   5/5  --unable to asess his bilateral hip flexors due to a arterial and venous catheter in place.  Tone and bulk:normal tone throughout; no atrophy noted Sensory: Pinprick and light touch intact throughout, bilaterally Deep Tendon Reflexes: 2+ and symmetric throughout both UE and KJ. 1+ in AJ bilaterally Plantars: Right: downgoing   Left: downgoing Cerebellar: normal finger-to-nose, CV: pulses palpable throughout     Felicie Morn PA-C [facility] [phone]  05/05/2012, 11:50 AM    Patient seen and examined.  Clinical course and management discussed.  Necessary edits performed.  I  agree with the above.  Assessment and plan of care developed and discussed below.    Assessment: 61 yo male with a history of ocular migraines who reported positive visual symptoms during and after procedure.  Symptoms very similar to ocular migraines he has had in the past.  Exam now at baseline.  CT of the head was ordered stat and shows no acute abnormalities.  Ischemic phenomenon unlikely.  Plan: 1.  No further neurologic intervention recommended at this time.  Patient at baseline.      Thana Farr, MD [facility] [phone]  05/05/2012  3:28 PM

## 2012-05-05 NOTE — Progress Notes (Signed)
Transported to radiology for stat CT scan.

## 2012-05-06 ENCOUNTER — Encounter: Payer: Self-pay | Admitting: Thoracic Surgery (Cardiothoracic Vascular Surgery)

## 2012-05-06 ENCOUNTER — Other Ambulatory Visit: Payer: Self-pay | Admitting: Thoracic Surgery (Cardiothoracic Vascular Surgery)

## 2012-05-06 ENCOUNTER — Institutional Professional Consult (permissible substitution) (INDEPENDENT_AMBULATORY_CARE_PROVIDER_SITE_OTHER): Payer: BC Managed Care – PPO | Admitting: Thoracic Surgery (Cardiothoracic Vascular Surgery)

## 2012-05-06 VITALS — BP 115/76 | HR 67 | Temp 97.5°F | Resp 18 | Ht 70.0 in | Wt 165.0 lb

## 2012-05-06 DIAGNOSIS — I359 Nonrheumatic aortic valve disorder, unspecified: Secondary | ICD-10-CM

## 2012-05-06 DIAGNOSIS — I7409 Other arterial embolism and thrombosis of abdominal aorta: Secondary | ICD-10-CM

## 2012-05-06 DIAGNOSIS — I350 Nonrheumatic aortic (valve) stenosis: Secondary | ICD-10-CM

## 2012-05-06 DIAGNOSIS — Q231 Congenital insufficiency of aortic valve: Secondary | ICD-10-CM | POA: Insufficient documentation

## 2012-05-06 DIAGNOSIS — Q2381 Bicuspid aortic valve: Secondary | ICD-10-CM

## 2012-05-06 HISTORY — DX: Nonrheumatic aortic (valve) stenosis: I35.0

## 2012-05-06 NOTE — Progress Notes (Signed)
[address]            [place] [address]          [phone]     CARDIOTHORACIC SURGERY CONSULTATION REPORT  Referring Provider is John Rotunda, MD PCP is John Rotunda, MD  Chief Complaint  Patient presents with  . Aortic Stenosis    eval for AVR    HPI:  Patient is a 61 year old married white male from [place] with known history of bicuspid aortic valve with aortic stenosis who has been referred to consider elective aortic valve replacement. The patient states that he was first noted to have a heart murmur on routine physical exam more than 15 years ago by his primary care physician. He was referred to Dr. Antoine Boyd and found to have bicuspid aortic valve with mild aortic stenosis. He has been followed intermittently ever since on a regular basis.  The patient first began developing symptoms of chest pain more than a year ago. Initially these symptoms only occurred when the patient was running or doing something very strenuous. Over the past 4-6 weeks the patient has developed significant acceleration of symptoms of exertional shortness of breath and fatigue. He was seen in followup by Dr. Antoine Boyd in early October and an echocardiogram was performed demonstrating significant progression of disease with severe aortic stenosis including the velocity across the valve measured 419 cm/s corresponding to peak and mean transvalvular gradients of 70 and 42 mm mercury respectively. Left ventricular systolic function remains well preserved. The patient subsequently underwent left and right heart catheterization by Dr. Antoine Boyd yesterday.  These confirmed the presence of severe aortic stenosis with a mean transvalvular gradient measured at cath 48 mm mercury corresponding to a aortic valve area estimated 0.6 cm. Following catheterization the patient had a brief transient visual disturbance for which he underwent a brain CT scan and was seen in consultation  by Dr. Thad Boyd from the neurology team.  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 The patient has now been referred to consider elective aortic valve replacement.  The patient reports a 4 to 6 week history of symptoms of significant exertional shortness of breath. He denies resting shortness of breath, PND, orthopnea, or lower extremity edema. He has had occasional dizzy spells without syncope. The patient still has episodes of chest tightness when he is running uphill which she has had for more than a year. He reports occasional fleeting episodes of chest pain at rest it seemed to come and goes sporadically.   Past Medical History  Diagnosis Date  . Hypertension     x16 years  . Hyperlipidemia   . Bicuspid aortic valve   . Migraine   . Aortic stenosis 05/06/2012    Past Surgical History  Procedure Date  . Hip arthroscopy     left hip  . Cardiac catheterization   . Closed manipulation shoulder     Family History  Problem Relation Age of Onset  . Coronary artery disease Neg Hx     Early  . Sudden death Neg Hx   . Heart disease Mother   . Heart disease Paternal Grandfather     History   Social History  . Marital Status: Married    Spouse Name: N/A    Number of Children: 2  . Years of Education: N/A   Occupational  History  . MINISTER   . truck driver    Social History Main Topics  . Smoking status: Never Smoker   . Smokeless tobacco: Never Used  . Alcohol Use: No  . Drug Use: No  . Sexually Active: Not on file   Other Topics Concern  . Not on file   Social History Narrative   Lives in [place] with wife.  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 aspirin 81 MG tablet Take 81 mg by mouth daily.      . Cholecalciferol 1000 UNITS tablet Take  2,000 Units by mouth daily.        . Fish Oil OIL Take 4 tablets by mouth daily.        . fluticasone (FLONASE) 50 MCG/ACT nasal spray Place into the nose as directed.        Marland Kitchen lisinopril (PRINIVIL,ZESTRIL) 5 MG tablet Take 10 mg by mouth daily.       . multivitamin (THERAGRAN) per tablet Take 1 tablet by mouth daily.         No current facility-administered medications for this visit.   Facility-Administered Medications Ordered in Other Visits  Medication Dose Route Frequency Provider Last Rate Last Dose  . [DISCONTINUED] 0.9 %  sodium chloride infusion   Intravenous Continuous John Rotunda, MD 50 mL/hr at 05/05/12 1003      No Known Allergies    Review of Systems:   General:  normal appetite, decreased energy, no weight gain, no weight loss, no fever  Cardiac:  + chest pain with exertion, occasional fleeting chest pain at rest, + SOB with mild-moderate exertion, no resting SOB, no PND, no orthopnea, occasional palpitations, no arrhythmia, no atrial fibrillation, no LE edema, occasional dizzy spells, no syncope  Respiratory:  + exertional shortness of breath, no home oxygen, no productive cough, no dry cough, no bronchitis, no wheezing, no hemoptysis, no asthma, no pain with inspiration or cough, no sleep apnea, no CPAP at night  GI:   no difficulty swallowing, no reflux, no frequent heartburn, no hiatal hernia, no abdominal pain, no constipation, no diarrhea, no hematochezia, no hematemesis, no melena  GU:   no dysuria,  no frequency, no urinary tract infection, + microscopic hematuria, no enlarged prostate, no kidney stones, no kidney disease  Vascular:  no pain suggestive of claudication, no pain in feet, no leg cramps, no varicose veins, no DVT, no non-healing foot ulcer  Neuro:   no stroke, no TIA's, no seizures, + headaches, + temporary blindness one eye,  no slurred speech, no peripheral neuropathy, no chronic pain, no instability of gait, no memory/cognitive  dysfunction  Musculoskeletal: no arthritis, no joint swelling, no myalgias, no difficulty walking, normal mobility   Skin:   no rash, no itching, no skin infections, no pressure sores or ulcerations  Psych:   no anxiety, no depression, no nervousness, no unusual recent stress  Eyes:   no blurry vision, no floaters, + recent vision changes, no wears glasses or contacts  ENT:   no hearing loss, no loose or painful teeth, no dentures, last saw dentist within 6 months  Hematologic:  no easy bruising, no abnormal bleeding, no clotting disorder, no frequent epistaxis  Endocrine:  no diabetes, does not check CBG's at home     Physical Exam:   BP 115/76  Pulse 67  Temp  97.5 F (36.4 C) (Oral)  Resp 18  Ht 5\' 10"  (1.778 m)  Wt 165 lb (74.844 kg)  BMI 23.68 kg/m2  SpO2 98%  General:  Healthy,  well-appearing  HEENT:  Unremarkable   Neck:   no JVD, no bruits, no adenopathy   Chest:   clear to auscultation, symmetrical breath sounds, no wheezes, no rhonchi   CV:   RRR, grade IV/VI crescendo/decrescendo systolic murmur   Abdomen:  soft, non-tender, no masses   Extremities:  warm, well-perfused, pulses palpable, no LE edema  Rectal/GU  Deferred  Neuro:   Grossly non-focal and symmetrical throughout  Skin:   Clean and dry, no rashes, no breakdown   Diagnostic Tests:  Transthoracic Echocardiography  Patient: John Boyd, John Boyd MR #: [id number] Study Date: 03/18/2012 Gender: M Age: 81 Height: 177.8cm Weight: 74.4kg BSA: 1.40m^2 Pt. Status: Room:  ATTENDING Hochrein, Anselm Pancoast REFERRING Hochrein, Fayrene Fearing PERFORMING [facility], [facility] SONOGRAPHER Philomena Course, RDCS cc:  ------------------------------------------------------------ LV EF: 60% - 65%  ------------------------------------------------------------ Indications: Aortic stenosis 424.1.  ------------------------------------------------------------ History: PMH: Acquired from the patient and from  the patient's chart. Mild aortic stenosis. Risk factors: Hypertension. Dyslipidemia.  ------------------------------------------------------------ Study Conclusions  - Left ventricle: The cavity size was normal. Wall thickness was increased in a pattern of mild LVH. Systolic function was normal. The estimated ejection fraction was in the range of 60% to 65%. - Aortic valve: AV is thickened, calcified with restricted motion. Peak and mean gradients through the valve are 70 and 42 mm Hg respectively consistent with severe AS> Transthoracic echocardiography. M-mode, complete 2D, spectral Doppler, and color Doppler. Height: Height: 177.8cm. Height: 70in. Weight: Weight: 74.4kg. Weight: 163.7lb. Body mass index: BMI: 23.5kg/m^2. Body surface area: BSA: 1.49m^2. Blood pressure: 108/76. Patient status: Outpatient. Location: [facility]  ------------------------------------------------------------  ------------------------------------------------------------ Left ventricle: The cavity size was normal. Wall thickness was increased in a pattern of mild LVH. Systolic function was normal. The estimated ejection fraction was in the range of 60% to 65%.  ------------------------------------------------------------ Aortic valve: AV is thickened, calcified with restricted motion. Peak and mean gradients through the valve are 70 and 42 mm Hg respectively consistent with severe AS> Doppler: VTI ratio of LVOT to aortic valve: 0.25. Indexed valve area: 0.45cm^2/m^2 (VTI). Peak velocity ratio of LVOT to aortic valve: 0.25. Indexed valve area: 0.45cm^2/m^2 (Vmax). Mean gradient: 42mm Hg (S). Peak gradient: 70mm Hg (S).  ------------------------------------------------------------ Mitral valve: Mildly thickened leaflets . Doppler: Trivial regurgitation. Peak gradient: 2mm Hg (D).  ------------------------------------------------------------ Left atrium: The atrium was normal in  size.  ------------------------------------------------------------ Right ventricle: The cavity size was normal. Wall thickness was normal. Systolic function was normal.  ------------------------------------------------------------ Tricuspid valve: Structurally normal valve. Leaflet separation was normal. Doppler: Transvalvular velocity was within the normal range. Trivial regurgitation.  ------------------------------------------------------------ Right atrium: The atrium was normal in size.  ------------------------------------------------------------ Pericardium: There was no pericardial effusion.  ------------------------------------------------------------  2D measurements Normal Doppler measurements Norma Left ventricle l LVID ED, 34.2 mm 43-52 Main pulmonary artery chord, Pressure, 27 mm Hg =30 PLAX S LVID ES, 22 mm 23-38 Left ventricle chord, Ea, lat 14. cm/s ----- PLAX ann, tiss 5 FS, chord, 36 % >29 DP PLAX E/Ea, lat 5.4 ----- LVPW, ED 14.1 mm ------ ann, tiss IVS/LVPW 1.04 <1.3 DP ratio, ED Ea, med 8.0 cm/s ----- Ventricular septum ann, tiss 1 IVS, ED 14.7 mm ------ DP LVOT E/Ea, med 9.7 ----- Diam, S 21 mm ------ ann, tiss 8 Area 3.46 cm^2 ------ DP  Diam 21 mm ------ LVOT Aorta Peak vel, 104 cm/s ----- Root diam, 29 mm ------ S ED VTI, S 26. cm ----- Left atrium 3 AP dim 33 mm ------ HR 55 bpm ----- AP dim 1.72 cm/m^2 <2.2 Stroke vol 91. ml ----- index 1 Cardiac 5 L/min ----- output Cardiac 2.6 L/(min-m ----- index ^2) Stroke 47. ml/m^2 ----- index 4 Aortic valve Peak vel, 419 cm/s ----- S Mean vel, 301 cm/s ----- S VTI, S 105 cm ----- Mean 42 mm Hg ----- gradient, S Peak 70 mm Hg ----- gradient, S VTI ratio 0.2 ----- LVOT/AV 5 Area index 0.4 cm^2/m^2 ----- (VTI) 5 Peak vel 0.2 ----- ratio, 5 LVOT/AV Area index 0.4 cm^2/m^2 ----- (Vmax) 5 Mitral valve Peak E vel 78. cm/s ----- 3 Peak A vel 58. cm/s ----- 6 Decelerati 204 ms  150-2 on time 30 Peak 2 mm Hg ----- gradient, D Peak E/A 1.3 ----- ratio Tricuspid valve Regurg 237 cm/s ----- peak vel Peak RV-RA 22 mm Hg ----- gradient, S Systemic veins Estimated 5 mm Hg ----- CVP Right ventricle Pressure, 27 mm Hg <30 S Sa vel, 13. cm/s ----- lat ann, 5 tiss DP  ------------------------------------------------------------ Prepared and Electronically Authenticated by  Dietrich Pates 2013-10-04T18:00:[id number]    Cardiac Catheterization Procedure Note   Name: SHYNE CHICKERING  MRN: [id number]  DOB: 11-17-1950  Procedure: Right Heart Cath, Left Heart Cath, Selective Coronary Angiography, LV angiography  Indication: Aortic stenosis.  Procedural Details: The right groin was prepped, draped, and anesthetized with 1% lidocaine. Using the modified Seldinger technique a 5 French sheath was placed in the right femoral artery and a 7 French sheath was placed in the right femoral vein. A Swan-Ganz catheter was used for the right heart catheterization. Standard protocol was followed for recording of right heart pressures and sampling of oxygen saturations. Fick cardiac output was calculated. Standard Judkins catheters were used for selective coronary angiography and left ventriculography. There were no immediate procedural complications. The patient was transferred to the post catheterization recovery area for further monitoring.  Procedural Findings:  Hemodynamics:  RA 1  RV 24/5  PA 15/4 Mean 9  PCWP 3  LV 146/15  AO 103/86  AO Valve area 0.6  AO Gradient mean 47.93  Oxygen saturations:  PA 70  AO 97  Cardiac Output (Fick) 4.1 Cardiac Index (Fick) 2.1  Coronary angiography:  Coronary dominance: right  Left mainstem: Normal  Left anterior descending (LAD): Large and wrapping the apex. Normal. Large D1 normal.  Left circumflex (LCx): AV groove normal. RI moderate sized and normal. OM large and normal.  Right coronary artery (RCA): Large and dominant. PDA is  moderate sized and normal. PL x 2 are small to moderate sized and normal.  Left ventriculography: Left ventricular systolic function is normal, LVEF is estimated at 55-65%, there is no significant mitral regurgitation  Final Conclusions: Severe aortic stenosis. Normal coronaries and LV function.  Recommendations: The patient will be referred for AVR. Of note he complained of a possible visual field defect immediately after the procedure. There were no other neurologic deficits and the visual complaint did seem to improve over a few minutes. I called the neurology service who is evaluating the patient for possible embolic CVA.  John Boyd  05/05/2012, 11:16 AM      Impression:  Severe symptomatic aortic stenosis with normal left ventricular systolic function in this 61 year old gentleman with known bicuspid aortic valve who is otherwise fairly healthy. I agree that elective aortic  valve replacement is appropriate at this time.  I personally reviewed the patient's recent transthoracic echocardiogram and heart catheterization and concur with findings as noted. The patient may be reasonably good candidate for minimally invasive approach for surgery, although CT angiogram of the aorta has not yet been performed.   Plan:  The patient and his wife were counseled at length regarding surgical alternatives with respect to valve replacement.  The indications for surgical intervention at this time were discussed at length compared with long-term prognosis with continued medical therapy and close followup.  In addition, discussion was held comparing in contrast and the risks of mechanical valve replacement and the need for lifelong anticoagulation versus use of a bioprosthetic tissue valve and the associated potential for late structural valve deterioration in failure. Other alternatives including the Ross autograft procedure, homograft aortic root replacement, stentless bioprosthetic tissue valve  replacement, valve repair, and transcatheter aortic valve replacement were discussed.  This discussion was placed in the context of the patient's age, lifestyle and other particular circumstances.  Alternative surgical approaches have been discussed, including a comparison between conventional sternotomy and minimally-invasive techniques.  The relative risks and benefits of each have been reviewed as they pertain to the patient's specific circumstances, and all of their questions have been addressed.  Expectations for the patient's postoperative surgical recovery and return to work have been reviewed. The patient desires and time to discuss treatment options and alternatives. He remains interested in the possibility of minimally invasive approach for surgery. We will plan CT angiogram of the chest abdomen and pelvis to more definitively size the patient's aortic root and proximal ascending thoracic aorta given his history of bicuspid aortic valve disease. We will also evaluate the descending thoracic and abdominal aorta and iliac vessels do to the possible need for access for minimally invasive approach for surgery. We'll plan to see the patient back in followup on Monday, December 2 to review the results of the CT scan and make final plans for surgery. All of his questions been addressed.  I spent in excess of 90 minutes with the patient and his wife during his examination in consultation.    Salvatore Decent. Cornelius Moras, MD 05/06/2012 1:47 PM

## 2012-05-06 NOTE — Patient Instructions (Signed)
Schedule CT scan ASAP and consider surgical options discussed today including mechanical vs tissue valve, minimally invasive vs conventional surgical technique

## 2012-05-09 ENCOUNTER — Encounter: Payer: BC Managed Care – PPO | Admitting: Thoracic Surgery (Cardiothoracic Vascular Surgery)

## 2012-05-09 ENCOUNTER — Encounter: Payer: Self-pay | Admitting: *Deleted

## 2012-05-09 NOTE — Telephone Encounter (Signed)
Letter completed.

## 2012-05-23 ENCOUNTER — Ambulatory Visit (HOSPITAL_COMMUNITY)
Admission: RE | Admit: 2012-05-23 | Discharge: 2012-05-23 | Disposition: A | Discharge status: Home / Self Care | Payer: BC Managed Care – PPO | Source: Ambulatory Visit | Attending: Thoracic Surgery (Cardiothoracic Vascular Surgery) | Admitting: Thoracic Surgery (Cardiothoracic Vascular Surgery)

## 2012-05-23 ENCOUNTER — Encounter: Payer: Self-pay | Admitting: Thoracic Surgery (Cardiothoracic Vascular Surgery)

## 2012-05-23 ENCOUNTER — Ambulatory Visit (INDEPENDENT_AMBULATORY_CARE_PROVIDER_SITE_OTHER): Payer: BC Managed Care – PPO | Admitting: Thoracic Surgery (Cardiothoracic Vascular Surgery)

## 2012-05-23 VITALS — BP 115/77 | HR 53 | Resp 18 | Ht 70.0 in | Wt 165.0 lb

## 2012-05-23 DIAGNOSIS — I7409 Other arterial embolism and thrombosis of abdominal aorta: Secondary | ICD-10-CM | POA: Insufficient documentation

## 2012-05-23 DIAGNOSIS — I359 Nonrheumatic aortic valve disorder, unspecified: Secondary | ICD-10-CM | POA: Insufficient documentation

## 2012-05-23 DIAGNOSIS — I350 Nonrheumatic aortic (valve) stenosis: Secondary | ICD-10-CM

## 2012-05-23 DIAGNOSIS — I2510 Atherosclerotic heart disease of native coronary artery without angina pectoris: Secondary | ICD-10-CM | POA: Insufficient documentation

## 2012-05-23 DIAGNOSIS — Q231 Congenital insufficiency of aortic valve: Secondary | ICD-10-CM

## 2012-05-23 MED ORDER — IOHEXOL 350 MG/ML SOLN
80.0000 mL | Freq: Once | INTRAVENOUS | Status: AC | PRN
  Administered 2012-05-23: 80 mL via INTRAVENOUS

## 2012-05-23 MED ORDER — NITROGLYCERIN 0.4 MG SL SUBL
SUBLINGUAL_TABLET | SUBLINGUAL | Status: AC
  Filled 2012-05-23: qty 25

## 2012-05-23 MED ORDER — METOPROLOL TARTRATE 50 MG PO TABS
50.0000 mg | ORAL_TABLET | Freq: Once | ORAL | Status: AC
  Administered 2012-05-23: 50 mg via ORAL

## 2012-05-23 MED ORDER — METOPROLOL TARTRATE 50 MG PO TABS
ORAL_TABLET | ORAL | Status: AC
  Filled 2012-05-23: qty 1

## 2012-05-23 NOTE — Progress Notes (Signed)
[address]            [name] [address]          [phone]     CARDIOTHORACIC SURGERY OFFICE NOTE  Referring Provider is [name], MD PCP is [name], MD   HPI:  Patient returns for followup of bicuspid aortic valve with severe symptomatic aortic stenosis. He was originally seen in consultation on 05/06/2012. He underwent CT angiogram earlier today and he returns to the office to discuss surgical options and make final plans for surgery. He reports no new problems or complaints although he still has fairly frequent episodes of substernal chest tightness and pressure with physical exertion. The remainder of his review of systems is unchanged from previously.   Current Outpatient Prescriptions  Medication Sig Dispense Refill  . aspirin 81 MG tablet Take 81 mg by mouth daily.      . Cholecalciferol 1000 UNITS tablet Take 2,000 Units by mouth daily.        Marland Kitchen desonide (DESOWEN) 0.05 % cream Apply topically 2 (two) times daily. PRN only      . Fish Oil OIL Take 4 tablets by mouth daily.        . fluticasone (FLONASE) 50 MCG/ACT nasal spray Place into the nose as directed. PRN Only      . lisinopril (PRINIVIL,ZESTRIL) 5 MG tablet Take 10 mg by mouth daily.       . multivitamin (THERAGRAN) per tablet Take 1 tablet by mouth daily.         No current facility-administered medications for this visit.   Facility-Administered Medications Ordered in Other Visits  Medication Dose Route Frequency Provider Last Rate Last Dose  . [COMPLETED] iohexol (OMNIPAQUE) 350 MG/ML injection 80 mL  80 mL Intravenous Once PRN Medication Radiologist, MD   80 mL at 05/23/12 1311  . [COMPLETED] iohexol (OMNIPAQUE) 350 MG/ML injection 80 mL  80 mL Intravenous Once PRN Medication Radiologist, MD   80 mL at 05/23/12 1316  . metoprolol (LOPRESSOR) 50 MG tablet           . [COMPLETED] metoprolol (LOPRESSOR) tablet 50 mg  50 mg Oral Once Trudie Reed, MD   50 mg at  05/23/12 1126  . nitroGLYCERIN (NITROSTAT) 0.4 MG SL tablet               Physical Exam:   BP 115/77  Pulse 53  Resp 18  Ht 5\' 10"  (1.778 m)  Wt 165 lb (74.844 kg)  BMI 23.68 kg/m2  SpO2 98%  General:  Well-appearing  Chest:   Clear to auscultation with symmetrical breath sounds  CV:   Regular rate and rhythm with prominent  Harsh systolic murmur  Incisions:  n/a  Abdomen:  Soft and nontender  Extremities:  Warm and well-perfused  Diagnostic Tests:  CT ANGIOGRAPHY OF THE HEART, CORONARY ARTERY, STRUCTURE, AND MORPHOLOGY   COMPARISON:  None   CONTRAST: 80mL OMNIPAQUE IOHEXOL 350 MG/ML SOLN   TECHNIQUE:  CT angiography of the coronary vessels was performed on a 256 channel system using prospective ECG gating.  A scout and ECG- gated noncontrast exam (for calcium scoring) were performed. Appropriate delay was determined by bolus tracking after injection of iodinated contrast, and an ECG-gated coronary CTA was performed with sub-mm slice collimation.  Imaging post processing was performed on an independent workstation creating multiplanar and 3- D images,  allowing for quantitative analysis of the heart and coronary arteries.  Note that this exam targets the heart and the chest was not imaged in its entirety.   PREMEDICATION: Lopressor  50 mg, P.O.   FINDINGS: Technical quality: Excellent. Heart rate:  52.   CORONARY ARTERIES: Left Main:              Negative. LAD:              Minimal calcified atherosclerotic plaque proximally with no associated stenosis. Diagonals:              Large-caliber D1, small D2 and D3. Negative. Ramus int:              Negative. Cx:               Negative. OMs:              Large OM1.  Negative. RCA:              Minimal calcified atherosclerotic proximally with no associated stenosis. PDA:                    Negative. Dominance:        Right.   CORONARY CALCIUM: Total Agatston Score:         35 MESA database percentile:      50th   AORTIC ROOT:   Aortic Valve Description:  Functionally bicuspid aortic valve with a median raphe between fused left and right cusps.  Independent noncoronary cusp.  Severe thickening and calcification of the valve with limited aortic valve area during systole (estimated at 0.65 cm2 by planimetry).  Notably, there appears to be some calcium which impinges upon the orifice during various portions of systole, and this calcified area was excluded in the planimetry measurement which likely overestimates the severity of stenosis as this calcium appeared to be relatively mobile.   Aortic Valve Area: 0.65 cm-sq   Aortic Annulus (systolic measurents):         Long-axis:  29.3 mm         Short-axis:  22.4 mm         Cross-sectional area:  4.96 cm-sq         Circumference:  78.1 mm   Sinuses of Valsalva (diastolic measurements):         L-SOV -     Width:  31.3 mm         R-SOV -     Width:  30.0 mm         [place]-SOV -    Width:  33.0 mm   ADDITIONAL AORTA AND PULMONARY MEASUREMENTS:   Aortic root (21 - 40 mm):     26.8 mm at the sinotubular junction                                                                 Ascending aorta: (< 40 mm):         36 mm                                 Descending aorta:  (< 40 mm):       22 mm  Main pulmonary artery: (< 30 mm):   18 mm                                 OTHER FINDINGS: Calcified granuloma in the right lower lobe larger more suspicious appearing pulmonary nodules or masses within the visualized thorax. No acute consolidative airspace disease or pleural effusions. Visualized portions of the upper abdomen are unremarkable There are no aggressive appearing lytic or blastic lesions noted in the visualized portions of the skeleton.   IMPRESSION: 1.  Severely sclerotic functionally bicuspid aortic valve (fusion of left and right cusps) severe aortic stenosis as detailed above. Aortic root  measurements, as above. Notably, there is no associated thoracic aortic aneurysm at this time. 2.  Mild nonobstructive coronary artery disease, as above.  The patient's total coronary artery calcium score is 35 which is 50th percentile for patient's of matched age, gender and race/ethnicity. Assessment for potential risk factor modification, dietary therapy or pharmacologic therapy may be warranted, if clinically indicated. 3.  Right coronary artery dominance.   Original Report Authenticated By: Trudie Reed, M.D.   CT ANGIOGRAPHY ABDOMEN AND PELVIS   Technique:  Multidetector CT imaging of the abdomen and pelvis was performed using the standard protocol during bolus administration of intravenous contrast.  Multiplanar reconstructed images including MIPs were obtained and reviewed to evaluate the vascular anatomy.   Contrast: 80mL OMNIPAQUE IOHEXOL 350 MG/ML SOLN   Comparison:  CT of the abdomen and pelvis 07/01/2010.   Findings:   Lung Bases: Unremarkable.   Abdomen/Pelvis:  Mild atherosclerosis is noted throughout the abdominal and pelvic vasculature, without evidence of aneurysm, dissection or major branch vessel occlusion.  The infrarenal abdominal aorta measures 1.6 cm in diameter shortly beneath the origin of the inferior mesenteric artery (its smallest point). Common iliac arteries measure 7 mm in diameter bilaterally. External iliac arteries measure 6 mm in diameter bilaterally.   The enhanced appearance of the liver, gallbladder, pancreas, spleen and bilateral adrenal glands is unremarkable.  There are multiple subcentimeter low attenuation lesions in the kidneys bilaterally which are too small to definitively characterize (statistically likely to represent cysts).  In addition, there is an 11 mm cyst in the lateral aspect of the interpolar region of the right kidney. Incidental note is made of a retroaortic left renal vein (normal anatomical variant).   No  ascites or pneumoperitoneum and no pathologic distension of small bowel.  No definite pathologic lymphadenopathy identified within the abdomen or pelvis.  Prostate and urinary bladder are unremarkable in appearance.   Musculoskeletal: Two small sclerotic lesions are noted in the left side of the pelvis, favored to represent small bone islands. There are no aggressive appearing lytic or blastic lesions noted in the visualized portions of the skeleton.    Review of the MIP images confirms the above findings.   IMPRESSION: 1.  No evidence of significant aortoiliac occlusive disease.  There is only mild atherosclerosis throughout the abdominal and pelvic vasculature, with measurements as above. 2.  No acute findings in the abdomen or pelvis.   3.  Multiple small low attenuation lesions in the kidneys bilaterally.  The majority of these are too small to definitively characterize but are statistically likely to represent small cysts.     Original Report Authenticated By: Trudie Reed, M.D.       Impression:  Functionally bicuspid native aortic valve with severe symptomatic aortic stenosis and normal left ventricular  systolic function. The patient does not have significant coronary artery disease. CT angiogram of the heart chest and abdomen demonstrate anatomical findings that appear relatively stable for minimally invasive approach for surgery.   Plan:  I spent in excess of 60 minutes with the patient and his wife discussing surgical alternatives as well as the risks and benefits of surgery. The patient is eager to proceed with surgery as soon as possible. We plan to proceed with aortic valve replacement via right mini thoracotomy approach on Thursday, December 19. The patient specifically requests that his valve be replaced using a bioprosthetic tissue valve. He understands and accepts all potential associated risks of surgery including but not limited to risk of death, stroke,  myocardial infarction, congestive heart failure, respiratory failure, renal failure, bleeding requiring blood transfusion, arrhythmia, heart block or bradycardia requiring permanent pacemaker, possible need for conversion to transverse or full median sternotomy, or other late complications related to valve replacement or the minimally invasive technique. All of his questions been addressed.   Salvatore Decent. Cornelius Moras, MD 05/23/2012 5:57 PM

## 2012-05-23 NOTE — Progress Notes (Signed)
Discharged walking with wife. Feels much better.

## 2012-05-24 ENCOUNTER — Other Ambulatory Visit: Payer: Self-pay | Admitting: *Deleted

## 2012-05-24 ENCOUNTER — Encounter (HOSPITAL_COMMUNITY): Payer: Self-pay | Admitting: Pharmacy Technician

## 2012-05-24 DIAGNOSIS — I359 Nonrheumatic aortic valve disorder, unspecified: Secondary | ICD-10-CM

## 2012-05-31 ENCOUNTER — Ambulatory Visit (HOSPITAL_COMMUNITY)
Admission: RE | Admit: 2012-05-31 | Discharge: 2012-05-31 | Disposition: A | Discharge status: Home / Self Care | Payer: BC Managed Care – PPO | Source: Ambulatory Visit | Attending: Thoracic Surgery (Cardiothoracic Vascular Surgery) | Admitting: Thoracic Surgery (Cardiothoracic Vascular Surgery)

## 2012-05-31 ENCOUNTER — Encounter (HOSPITAL_COMMUNITY)
Admission: RE | Admit: 2012-05-31 | Discharge: 2012-05-31 | Disposition: A | Discharge status: Home / Self Care | Payer: BC Managed Care – PPO | Source: Ambulatory Visit | Attending: Thoracic Surgery (Cardiothoracic Vascular Surgery) | Admitting: Thoracic Surgery (Cardiothoracic Vascular Surgery)

## 2012-05-31 ENCOUNTER — Encounter (HOSPITAL_COMMUNITY): Payer: Self-pay

## 2012-05-31 ENCOUNTER — Telehealth: Payer: Self-pay | Admitting: *Deleted

## 2012-05-31 ENCOUNTER — Inpatient Hospital Stay (HOSPITAL_COMMUNITY)
Admission: RE | Admit: 2012-05-31 | Discharge: 2012-05-31 | Disposition: A | Discharge status: Home / Self Care | Payer: BC Managed Care – PPO | Source: Ambulatory Visit | Attending: Thoracic Surgery (Cardiothoracic Vascular Surgery) | Admitting: Thoracic Surgery (Cardiothoracic Vascular Surgery)

## 2012-05-31 VITALS — BP 130/81 | HR 75 | Temp 98.0°F | Resp 20 | Ht 69.0 in | Wt 164.1 lb

## 2012-05-31 DIAGNOSIS — I359 Nonrheumatic aortic valve disorder, unspecified: Secondary | ICD-10-CM | POA: Insufficient documentation

## 2012-05-31 DIAGNOSIS — Z01810 Encounter for preprocedural cardiovascular examination: Secondary | ICD-10-CM | POA: Insufficient documentation

## 2012-05-31 DIAGNOSIS — I350 Nonrheumatic aortic (valve) stenosis: Secondary | ICD-10-CM

## 2012-05-31 HISTORY — DX: Hematuria, unspecified: R31.9

## 2012-05-31 LAB — URINE MICROSCOPIC-ADD ON

## 2012-05-31 LAB — PULMONARY FUNCTION TEST

## 2012-05-31 LAB — CBC
Hemoglobin: 15.6 g/dL (ref 13.0–17.0)
MCH: 31.2 pg (ref 26.0–34.0)
MCHC: 35.3 g/dL (ref 30.0–36.0)
MCV: 88.4 fL (ref 78.0–100.0)
RBC: 5 MIL/uL (ref 4.22–5.81)

## 2012-05-31 LAB — COMPREHENSIVE METABOLIC PANEL
CO2: 28 mEq/L (ref 19–32)
Calcium: 9.6 mg/dL (ref 8.4–10.5)
Creatinine, Ser: 1.14 mg/dL (ref 0.50–1.35)
GFR calc Af Amer: 79 mL/min — ABNORMAL LOW (ref >90)
GFR calc non Af Amer: 68 mL/min — ABNORMAL LOW (ref >90)
Glucose, Bld: 92 mg/dL (ref 70–99)
Total Protein: 7.2 g/dL (ref 6.0–8.3)

## 2012-05-31 LAB — URINALYSIS, ROUTINE W REFLEX MICROSCOPIC
Ketones, ur: NEGATIVE mg/dL
Leukocytes, UA: NEGATIVE
Nitrite: NEGATIVE
Protein, ur: NEGATIVE mg/dL
pH: 7 (ref 5.0–8.0)

## 2012-05-31 LAB — PROTIME-INR
INR: 1 (ref 0.00–1.49)
Prothrombin Time: 13.1 seconds (ref 11.6–15.2)

## 2012-05-31 MED ORDER — CHLORHEXIDINE GLUCONATE 4 % EX LIQD: 30.0000 mL | CUTANEOUS | Status: DC

## 2012-05-31 NOTE — Progress Notes (Signed)
ABG DOS PER PHLEBOTOMIST

## 2012-05-31 NOTE — Telephone Encounter (Signed)
Completed heart education with patient and his wife.  Answered all questions.  Told to call if any other concerns came up.

## 2012-05-31 NOTE — Progress Notes (Signed)
VASCULAR LAB PRELIMINARY  PRELIMINARY  PRELIMINARY  PRELIMINARY  Pre-op Cardiac Surgery  Carotid Findings:  Bilateral:  No evidence of hemodynamically significant internal carotid artery stenosis.   Vertebral artery flow is antegrade.      Upper Extremity Right Left  Brachial Pressures 117 T 108 T  Radial Waveforms T M  Ulnar Waveforms M M  Palmar Arch (Allen's Test) * *   Findings:  *Doppler waveforms obliterate with ulnar and remain normal with radial compressions bilaterally.   Azrael Huss, RVT 05/31/2012, 1:05 PM

## 2012-05-31 NOTE — Pre-Procedure Instructions (Signed)
John Boyd  05/31/2012   Your procedure is scheduled on:  Jun 02, 2012 (Thursday)  Report to [facility] at 5:30 AM.  Call this number if you have problems the morning of surgery: [phone]   Remember:   Do not eat food:After Midnight.    Take these medicines the morning of surgery with A SIP OF WATER: flonase   Do not wear jewelry, make-up or nail polish.  Do not wear lotions, powders, or perfumes. You may wear deodorant.  Do not shave 48 hours prior to surgery. Men may shave face and neck.  Do not bring valuables to the hospital.  Contacts, dentures or bridgework may not be worn into surgery.  Leave suitcase in the car. After surgery it may be brought to your room.  For patients admitted to the hospital, checkout time is 11:00 AM the day of discharge.   Patients discharged the day of surgery will not be allowed to drive home.  Name and phone number of your driver:   Special Instructions: Incentive Spirometry - Practice and bring it with you on the day of surgery. Shower using CHG 2 nights before surgery and the night before surgery.  If you shower the day of surgery use CHG.  Use special wash - you have one bottle of CHG for all showers.  You should use approximately 1/3 of the bottle for each shower.   Please read over the following fact sheets that you were given: Pain Booklet, Coughing and Deep Breathing and Surgical Site Infection Prevention

## 2012-06-01 LAB — HEMOGLOBIN A1C
Hgb A1c MFr Bld: 5.3 % (ref <5.7)
Mean Plasma Glucose: 105 mg/dL (ref <117)

## 2012-06-01 MED ORDER — POTASSIUM CHLORIDE 2 MEQ/ML IV SOLN
80.0000 meq | INTRAVENOUS | Status: DC
  Filled 2012-06-01: qty 40

## 2012-06-01 MED ORDER — DEXMEDETOMIDINE HCL IN NACL 400 MCG/100ML IV SOLN
0.1000 ug/kg/h | INTRAVENOUS | Status: AC
  Administered 2012-06-02: 0.3 ug/kg/h via INTRAVENOUS
  Filled 2012-06-01: qty 100

## 2012-06-01 MED ORDER — MAGNESIUM SULFATE 50 % IJ SOLN
40.0000 meq | INTRAMUSCULAR | Status: DC
  Filled 2012-06-01: qty 10

## 2012-06-01 MED ORDER — SODIUM CHLORIDE 0.9 % IV SOLN
INTRAVENOUS | Status: AC
  Administered 2012-06-02: 70 mL/h via INTRAVENOUS
  Filled 2012-06-01: qty 40

## 2012-06-01 MED ORDER — DEXTROSE 5 % IV SOLN
750.0000 mg | INTRAVENOUS | Status: DC
  Filled 2012-06-01: qty 750

## 2012-06-01 MED ORDER — EPINEPHRINE HCL 1 MG/ML IJ SOLN
0.5000 ug/min | INTRAVENOUS | Status: DC
  Filled 2012-06-01: qty 4

## 2012-06-01 MED ORDER — PHENYLEPHRINE HCL 10 MG/ML IJ SOLN
30.0000 ug/min | INTRAVENOUS | Status: AC
  Administered 2012-06-02: 10 ug/min via INTRAVENOUS
  Administered 2012-06-02: 20 ug/min via INTRAVENOUS
  Filled 2012-06-01: qty 2

## 2012-06-01 MED ORDER — PLASMA-LYTE 148 IV SOLN
INTRAVENOUS | Status: AC
  Administered 2012-06-02: 10:00:00
  Filled 2012-06-01: qty 2.5

## 2012-06-01 MED ORDER — DOPAMINE-DEXTROSE 3.2-5 MG/ML-% IV SOLN
2.0000 ug/kg/min | INTRAVENOUS | Status: DC
  Filled 2012-06-01: qty 250

## 2012-06-01 MED ORDER — SODIUM CHLORIDE 0.9 % IV SOLN
1250.0000 mg | INTRAVENOUS | Status: AC
  Administered 2012-06-02: 1250 mg via INTRAVENOUS
  Filled 2012-06-01 (×2): qty 1250

## 2012-06-01 MED ORDER — METOPROLOL TARTRATE 12.5 MG HALF TABLET
12.5000 mg | ORAL_TABLET | Freq: Once | ORAL | Status: AC
  Administered 2012-06-02: 12.5 mg via ORAL
  Filled 2012-06-01: qty 1

## 2012-06-01 MED ORDER — NITROGLYCERIN IN D5W 200-5 MCG/ML-% IV SOLN
2.0000 ug/min | INTRAVENOUS | Status: AC
  Administered 2012-06-02: 3 ug/min via INTRAVENOUS
  Filled 2012-06-01: qty 250

## 2012-06-01 MED ORDER — INSULIN REGULAR HUMAN 100 UNIT/ML IJ SOLN
INTRAMUSCULAR | Status: AC
  Administered 2012-06-02: 1 [IU]/h via INTRAVENOUS
  Filled 2012-06-01: qty 1

## 2012-06-01 MED ORDER — DEXTROSE 5 % IV SOLN
1.5000 g | INTRAVENOUS | Status: AC
  Administered 2012-06-02: .75 g via INTRAVENOUS
  Administered 2012-06-02: 1.5 g via INTRAVENOUS
  Filled 2012-06-01 (×2): qty 1.5

## 2012-06-02 ENCOUNTER — Ambulatory Visit (HOSPITAL_COMMUNITY): Payer: BC Managed Care – PPO | Admitting: *Deleted

## 2012-06-02 ENCOUNTER — Encounter (HOSPITAL_COMMUNITY): Payer: Self-pay | Admitting: *Deleted

## 2012-06-02 ENCOUNTER — Inpatient Hospital Stay (HOSPITAL_COMMUNITY): Payer: BC Managed Care – PPO | Admitting: Certified Registered Nurse Anesthetist

## 2012-06-02 ENCOUNTER — Encounter (HOSPITAL_COMMUNITY)
Admission: RE | Disposition: A | Discharge status: Home / Self Care | Payer: Self-pay | Source: Ambulatory Visit | Attending: Thoracic Surgery (Cardiothoracic Vascular Surgery)

## 2012-06-02 ENCOUNTER — Inpatient Hospital Stay (HOSPITAL_COMMUNITY): Payer: BC Managed Care – PPO

## 2012-06-02 ENCOUNTER — Encounter (HOSPITAL_COMMUNITY): Payer: Self-pay | Admitting: Thoracic Surgery (Cardiothoracic Vascular Surgery)

## 2012-06-02 ENCOUNTER — Inpatient Hospital Stay (HOSPITAL_COMMUNITY)
Admission: RE | Admit: 2012-06-02 | Discharge: 2012-06-07 | DRG: 105 | Disposition: A | Discharge status: Home / Self Care | Payer: BC Managed Care – PPO | Source: Ambulatory Visit | Attending: Thoracic Surgery (Cardiothoracic Vascular Surgery) | Admitting: Thoracic Surgery (Cardiothoracic Vascular Surgery)

## 2012-06-02 ENCOUNTER — Encounter (HOSPITAL_COMMUNITY): Payer: Self-pay | Admitting: Certified Registered Nurse Anesthetist

## 2012-06-02 DIAGNOSIS — I359 Nonrheumatic aortic valve disorder, unspecified: Principal | ICD-10-CM

## 2012-06-02 DIAGNOSIS — K5900 Constipation, unspecified: Secondary | ICD-10-CM | POA: Diagnosis present

## 2012-06-02 DIAGNOSIS — Z79899 Other long term (current) drug therapy: Secondary | ICD-10-CM

## 2012-06-02 DIAGNOSIS — E785 Hyperlipidemia, unspecified: Secondary | ICD-10-CM | POA: Diagnosis present

## 2012-06-02 DIAGNOSIS — Y831 Surgical operation with implant of artificial internal device as the cause of abnormal reaction of the patient, or of later complication, without mention of misadventure at the time of the procedure: Secondary | ICD-10-CM | POA: Diagnosis not present

## 2012-06-02 DIAGNOSIS — Z7982 Long term (current) use of aspirin: Secondary | ICD-10-CM

## 2012-06-02 DIAGNOSIS — D62 Acute posthemorrhagic anemia: Secondary | ICD-10-CM | POA: Diagnosis not present

## 2012-06-02 DIAGNOSIS — T82897A Other specified complication of cardiac prosthetic devices, implants and grafts, initial encounter: Secondary | ICD-10-CM | POA: Diagnosis not present

## 2012-06-02 DIAGNOSIS — Y9210 Unspecified residential institution as the place of occurrence of the external cause: Secondary | ICD-10-CM | POA: Diagnosis not present

## 2012-06-02 DIAGNOSIS — IMO0002 Reserved for concepts with insufficient information to code with codable children: Secondary | ICD-10-CM

## 2012-06-02 DIAGNOSIS — Q231 Congenital insufficiency of aortic valve: Secondary | ICD-10-CM

## 2012-06-02 DIAGNOSIS — E8779 Other fluid overload: Secondary | ICD-10-CM | POA: Diagnosis present

## 2012-06-02 DIAGNOSIS — Z953 Presence of xenogenic heart valve: Secondary | ICD-10-CM

## 2012-06-02 DIAGNOSIS — I10 Essential (primary) hypertension: Secondary | ICD-10-CM | POA: Diagnosis present

## 2012-06-02 DIAGNOSIS — D696 Thrombocytopenia, unspecified: Secondary | ICD-10-CM | POA: Diagnosis present

## 2012-06-02 HISTORY — DX: Presence of xenogenic heart valve: Z95.3

## 2012-06-02 HISTORY — PX: INTRAOPERATIVE TRANSESOPHAGEAL ECHOCARDIOGRAM: SHX5062

## 2012-06-02 HISTORY — PX: AORTIC VALVE REPLACEMENT: SHX41

## 2012-06-02 HISTORY — PX: EXPLORATION POST OPERATIVE OPEN HEART: SHX5061

## 2012-06-02 LAB — POCT I-STAT 3, ART BLOOD GAS (G3+)
Acid-base deficit: 2 mmol/L (ref 0.0–2.0)
Bicarbonate: 24.3 mEq/L — ABNORMAL HIGH (ref 20.0–24.0)
O2 Saturation: 100 %
O2 Saturation: 99 %
O2 Saturation: 99 %
O2 Saturation: 96 %
TCO2: 26 mmol/L (ref 0–100)
TCO2: 23 mmol/L (ref 0–100)
pCO2 arterial: 45.8 mmHg — ABNORMAL HIGH (ref 35.0–45.0)
pCO2 arterial: 47.7 mmHg — ABNORMAL HIGH (ref 35.0–45.0)
pCO2 arterial: 33.4 mmHg — ABNORMAL LOW (ref 35.0–45.0)
pCO2 arterial: 37.7 mmHg (ref 35.0–45.0)
pH, Arterial: 7.314 — ABNORMAL LOW (ref 7.350–7.450)
pO2, Arterial: 146 mmHg — ABNORMAL HIGH (ref 80.0–100.0)
pO2, Arterial: 77 mmHg — ABNORMAL LOW (ref 80.0–100.0)

## 2012-06-02 LAB — DIC (DISSEMINATED INTRAVASCULAR COAGULATION)PANEL
Fibrinogen: 155 mg/dL — ABNORMAL LOW (ref 204–475)
INR: 1.41 (ref 0.00–1.49)
Prothrombin Time: 16.9 seconds — ABNORMAL HIGH (ref 11.6–15.2)
Smear Review: NONE SEEN
aPTT: 40 seconds — ABNORMAL HIGH (ref 24–37)

## 2012-06-02 LAB — COMPREHENSIVE METABOLIC PANEL
AST: 29 U/L (ref 0–37)
CO2: 22 mEq/L (ref 19–32)
Calcium: 7.4 mg/dL — ABNORMAL LOW (ref 8.4–10.5)
Chloride: 108 mEq/L (ref 96–112)
Creatinine, Ser: 0.75 mg/dL (ref 0.50–1.35)
GFR calc Af Amer: >90 mL/min (ref >90)
GFR calc non Af Amer: >90 mL/min (ref >90)
Glucose, Bld: 133 mg/dL — ABNORMAL HIGH (ref 70–99)
Total Bilirubin: 0.7 mg/dL (ref 0.3–1.2)

## 2012-06-02 LAB — CBC
HCT: 35.4 % — ABNORMAL LOW (ref 39.0–52.0)
HCT: 35.2 % — ABNORMAL LOW (ref 39.0–52.0)
Hemoglobin: 7.2 g/dL — ABNORMAL LOW (ref 13.0–17.0)
Hemoglobin: 12.6 g/dL — ABNORMAL LOW (ref 13.0–17.0)
MCH: 30.4 pg (ref 26.0–34.0)
MCH: 30.4 pg (ref 26.0–34.0)
MCH: 30.5 pg (ref 26.0–34.0)
MCHC: 35.8 g/dL (ref 30.0–36.0)
MCV: 86.8 fL (ref 78.0–100.0)
MCV: 85.2 fL (ref 78.0–100.0)
Platelets: 127 10*3/uL — ABNORMAL LOW (ref 150–400)
Platelets: 124 10*3/uL — ABNORMAL LOW (ref 150–400)
RBC: 2.37 MIL/uL — ABNORMAL LOW (ref 4.22–5.81)
RDW: 12.3 % (ref 11.5–15.5)
WBC: 18.4 10*3/uL — ABNORMAL HIGH (ref 4.0–10.5)
WBC: 12.2 10*3/uL — ABNORMAL HIGH (ref 4.0–10.5)

## 2012-06-02 LAB — POCT I-STAT 4, (NA,K, GLUC, HGB,HCT)
Glucose, Bld: 111 mg/dL — ABNORMAL HIGH (ref 70–99)
Glucose, Bld: 90 mg/dL (ref 70–99)
Glucose, Bld: 139 mg/dL — ABNORMAL HIGH (ref 70–99)
Glucose, Bld: 131 mg/dL — ABNORMAL HIGH (ref 70–99)
Glucose, Bld: 128 mg/dL — ABNORMAL HIGH (ref 70–99)
HCT: 26 % — ABNORMAL LOW (ref 39.0–52.0)
HCT: 36 % — ABNORMAL LOW (ref 39.0–52.0)
Hemoglobin: 9.5 g/dL — ABNORMAL LOW (ref 13.0–17.0)
Hemoglobin: 12.2 g/dL — ABNORMAL LOW (ref 13.0–17.0)
Potassium: 5 mEq/L (ref 3.5–5.1)
Potassium: 4.2 mEq/L (ref 3.5–5.1)
Potassium: 4.4 mEq/L (ref 3.5–5.1)
Sodium: 134 mEq/L — ABNORMAL LOW (ref 135–145)
Sodium: 135 mEq/L (ref 135–145)
Sodium: 141 mEq/L (ref 135–145)

## 2012-06-02 LAB — PREPARE RBC (CROSSMATCH)

## 2012-06-02 LAB — PLATELET COUNT: Platelets: 156 10*3/uL (ref 150–400)

## 2012-06-02 LAB — HEMOGLOBIN AND HEMATOCRIT, BLOOD: HCT: 31.1 % — ABNORMAL LOW (ref 39.0–52.0)

## 2012-06-02 LAB — BLOOD GAS, ARTERIAL
Bicarbonate: 22.6 mEq/L (ref 20.0–24.0)
FIO2: 21 %
O2 Saturation: 99.4 %
pO2, Arterial: 126 mmHg — ABNORMAL HIGH (ref 80.0–100.0)

## 2012-06-02 LAB — POCT I-STAT GLUCOSE: Glucose, Bld: 92 mg/dL (ref 70–99)

## 2012-06-02 LAB — POCT I-STAT, CHEM 8
BUN: 12 mg/dL (ref 6–23)
Creatinine, Ser: 0.8 mg/dL (ref 0.50–1.35)
Sodium: 141 mEq/L (ref 135–145)
TCO2: 21 mmol/L (ref 0–100)

## 2012-06-02 LAB — APTT: aPTT: 43 seconds — ABNORMAL HIGH (ref 24–37)

## 2012-06-02 SURGERY — EXPLORATION POST OPERATIVE OPEN HEART
Anesthesia: General | Site: Chest | Wound class: Clean

## 2012-06-02 SURGERY — REPLACEMENT, AORTIC VALVE, MINIMALLY INVASIVE
Anesthesia: General | Site: Chest | Wound class: Clean

## 2012-06-02 MED ORDER — METOPROLOL TARTRATE 12.5 MG HALF TABLET
12.5000 mg | ORAL_TABLET | Freq: Two times a day (BID) | ORAL | Status: DC
  Administered 2012-06-04 – 2012-06-07 (×4): 12.5 mg via ORAL
  Filled 2012-06-02 (×11): qty 1

## 2012-06-02 MED ORDER — FAMOTIDINE IN NACL 20-0.9 MG/50ML-% IV SOLN
20.0000 mg | Freq: Two times a day (BID) | INTRAVENOUS | Status: DC
  Administered 2012-06-03: 20 mg via INTRAVENOUS
  Filled 2012-06-02: qty 50

## 2012-06-02 MED ORDER — HEPARIN SODIUM (PORCINE) 1000 UNIT/ML IJ SOLN
INTRAMUSCULAR | Status: DC | PRN
  Administered 2012-06-02: 25000 [IU] via INTRAVENOUS

## 2012-06-02 MED ORDER — SODIUM CHLORIDE 0.9 % IJ SOLN: 3.0000 mL | Freq: Two times a day (BID) | INTRAMUSCULAR | Status: DC

## 2012-06-02 MED ORDER — SODIUM CHLORIDE 0.9 % IJ SOLN
OROMUCOSAL | Status: DC | PRN
  Administered 2012-06-02: 19:00:00 via TOPICAL

## 2012-06-02 MED ORDER — LACTATED RINGERS IV SOLN
INTRAVENOUS | Status: DC
  Administered 2012-06-02: 15:00:00 via INTRAVENOUS

## 2012-06-02 MED ORDER — POTASSIUM CHLORIDE 10 MEQ/50ML IV SOLN
10.0000 meq | INTRAVENOUS | Status: DC
  Administered 2012-06-02 (×2): 10 meq via INTRAVENOUS

## 2012-06-02 MED ORDER — OXYCODONE HCL 5 MG PO TABS
5.0000 mg | ORAL_TABLET | ORAL | Status: DC | PRN
  Administered 2012-06-03: 10 mg via ORAL
  Administered 2012-06-04: 5 mg via ORAL
  Administered 2012-06-05: 10 mg via ORAL
  Filled 2012-06-02: qty 1
  Filled 2012-06-02 (×3): qty 2

## 2012-06-02 MED ORDER — SODIUM CHLORIDE 0.45 % IV SOLN: INTRAVENOUS | Status: DC

## 2012-06-02 MED ORDER — KETOROLAC TROMETHAMINE 30 MG/ML IJ SOLN: 30.0000 mg | Freq: Once | INTRAMUSCULAR | Status: DC | PRN

## 2012-06-02 MED ORDER — MORPHINE SULFATE 2 MG/ML IJ SOLN: 2.0000 mg | INTRAMUSCULAR | Status: DC | PRN

## 2012-06-02 MED ORDER — FENTANYL CITRATE 0.05 MG/ML IJ SOLN
INTRAMUSCULAR | Status: DC | PRN
  Administered 2012-06-02 (×2): 100 ug via INTRAVENOUS
  Administered 2012-06-02: 50 ug via INTRAVENOUS

## 2012-06-02 MED ORDER — ASPIRIN 81 MG PO CHEW: 324.0000 mg | CHEWABLE_TABLET | Freq: Every day | ORAL | Status: DC

## 2012-06-02 MED ORDER — ACETAMINOPHEN 500 MG PO TABS: 1000.0000 mg | ORAL_TABLET | Freq: Four times a day (QID) | ORAL | Status: DC

## 2012-06-02 MED ORDER — VECURONIUM BROMIDE 10 MG IV SOLR
INTRAVENOUS | Status: DC | PRN
  Administered 2012-06-02: 1 mg via INTRAVENOUS
  Administered 2012-06-02: 5 mg via INTRAVENOUS

## 2012-06-02 MED ORDER — SODIUM CHLORIDE 0.9 % IJ SOLN
3.0000 mL | Freq: Two times a day (BID) | INTRAMUSCULAR | Status: DC
  Administered 2012-06-03: 3 mL via INTRAVENOUS

## 2012-06-02 MED ORDER — SODIUM CHLORIDE 0.45 % IV SOLN
INTRAVENOUS | Status: DC
  Administered 2012-06-02: 15:00:00 via INTRAVENOUS

## 2012-06-02 MED ORDER — MORPHINE SULFATE 2 MG/ML IJ SOLN
2.0000 mg | INTRAMUSCULAR | Status: DC | PRN
  Administered 2012-06-03: 4 mg via INTRAVENOUS
  Filled 2012-06-02 (×2): qty 1

## 2012-06-02 MED ORDER — SODIUM CHLORIDE 0.9 % IJ SOLN: 3.0000 mL | INTRAMUSCULAR | Status: DC | PRN

## 2012-06-02 MED ORDER — LACTATED RINGERS IV SOLN
INTRAVENOUS | Status: DC | PRN
  Administered 2012-06-02: 07:00:00 via INTRAVENOUS

## 2012-06-02 MED ORDER — PROPOFOL 10 MG/ML IV BOLUS
INTRAVENOUS | Status: DC | PRN
  Administered 2012-06-02: 50 mg via INTRAVENOUS

## 2012-06-02 MED ORDER — INSULIN REGULAR BOLUS VIA INFUSION
0.0000 [IU] | Freq: Three times a day (TID) | INTRAVENOUS | Status: DC
  Filled 2012-06-02: qty 10

## 2012-06-02 MED ORDER — BISACODYL 10 MG RE SUPP: 10.0000 mg | Freq: Every day | RECTAL | Status: DC

## 2012-06-02 MED ORDER — CALCIUM CHLORIDE 10 % IV SOLN
1.0000 g | Freq: Once | INTRAVENOUS | Status: DC | PRN
  Filled 2012-06-02: qty 10

## 2012-06-02 MED ORDER — ROCURONIUM BROMIDE 100 MG/10ML IV SOLN
INTRAVENOUS | Status: DC | PRN
  Administered 2012-06-02 (×2): 50 mg via INTRAVENOUS

## 2012-06-02 MED ORDER — SODIUM CHLORIDE 0.9 % IR SOLN
Status: DC | PRN
  Administered 2012-06-02: 3000 mL

## 2012-06-02 MED ORDER — METOPROLOL TARTRATE 12.5 MG HALF TABLET
12.5000 mg | ORAL_TABLET | Freq: Two times a day (BID) | ORAL | Status: DC
  Filled 2012-06-02: qty 1

## 2012-06-02 MED ORDER — MORPHINE SULFATE 2 MG/ML IJ SOLN
1.0000 mg | INTRAMUSCULAR | Status: AC | PRN
  Administered 2012-06-03 (×2): 2 mg via INTRAVENOUS
  Filled 2012-06-02 (×2): qty 1

## 2012-06-02 MED ORDER — LACTATED RINGERS IV SOLN
INTRAVENOUS | Status: DC | PRN
  Administered 2012-06-02 (×2): via INTRAVENOUS

## 2012-06-02 MED ORDER — ALBUMIN HUMAN 5 % IV SOLN
250.0000 mL | INTRAVENOUS | Status: DC | PRN
  Administered 2012-06-02 (×2): 250 mL via INTRAVENOUS

## 2012-06-02 MED ORDER — VANCOMYCIN HCL IN DEXTROSE 1-5 GM/200ML-% IV SOLN
1000.0000 mg | Freq: Once | INTRAVENOUS | Status: AC
  Administered 2012-06-03: 1000 mg via INTRAVENOUS
  Filled 2012-06-02: qty 200

## 2012-06-02 MED ORDER — DEXMEDETOMIDINE HCL IN NACL 200 MCG/50ML IV SOLN: 0.1000 ug/kg/h | INTRAVENOUS | Status: DC

## 2012-06-02 MED ORDER — ACETAMINOPHEN 160 MG/5ML PO SOLN: 975.0000 mg | Freq: Four times a day (QID) | ORAL | Status: DC

## 2012-06-02 MED ORDER — VANCOMYCIN HCL IN DEXTROSE 1-5 GM/200ML-% IV SOLN
1000.0000 mg | Freq: Once | INTRAVENOUS | Status: DC
  Filled 2012-06-02: qty 200

## 2012-06-02 MED ORDER — KETOROLAC TROMETHAMINE 30 MG/ML IJ SOLN: 30.0000 mg | Freq: Once | INTRAMUSCULAR | Status: AC | PRN

## 2012-06-02 MED ORDER — CEFUROXIME SODIUM 1.5 G IJ SOLR
1.5000 g | Freq: Two times a day (BID) | INTRAMUSCULAR | Status: DC
  Administered 2012-06-02: 1.5 g via INTRAVENOUS
  Filled 2012-06-02: qty 1.5

## 2012-06-02 MED ORDER — DEXTROSE 5 % IV SOLN
1.5000 g | Freq: Two times a day (BID) | INTRAVENOUS | Status: AC
  Administered 2012-06-03 – 2012-06-04 (×4): 1.5 g via INTRAVENOUS
  Filled 2012-06-02 (×4): qty 1.5

## 2012-06-02 MED ORDER — DOCUSATE SODIUM 100 MG PO CAPS
200.0000 mg | ORAL_CAPSULE | Freq: Every day | ORAL | Status: DC
  Administered 2012-06-03 – 2012-06-06 (×4): 200 mg via ORAL
  Filled 2012-06-02 (×5): qty 2

## 2012-06-02 MED ORDER — BISACODYL 5 MG PO TBEC: 10.0000 mg | DELAYED_RELEASE_TABLET | Freq: Every day | ORAL | Status: DC

## 2012-06-02 MED ORDER — DOCUSATE SODIUM 100 MG PO CAPS: 200.0000 mg | ORAL_CAPSULE | Freq: Every day | ORAL | Status: DC

## 2012-06-02 MED ORDER — MIDAZOLAM HCL 5 MG/5ML IJ SOLN
INTRAMUSCULAR | Status: DC | PRN
  Administered 2012-06-02 (×2): 2 mg via INTRAVENOUS
  Administered 2012-06-02: 3 mg via INTRAVENOUS
  Administered 2012-06-02 (×3): 1 mg via INTRAVENOUS

## 2012-06-02 MED ORDER — ONDANSETRON HCL 4 MG/2ML IJ SOLN: 4.0000 mg | Freq: Four times a day (QID) | INTRAMUSCULAR | Status: DC | PRN

## 2012-06-02 MED ORDER — ACETAMINOPHEN 500 MG PO TABS
1000.0000 mg | ORAL_TABLET | Freq: Four times a day (QID) | ORAL | Status: DC
  Administered 2012-06-03 – 2012-06-07 (×15): 1000 mg via ORAL
  Filled 2012-06-02 (×19): qty 2

## 2012-06-02 MED ORDER — ROCURONIUM BROMIDE 100 MG/10ML IV SOLN
INTRAVENOUS | Status: DC | PRN
  Administered 2012-06-02 (×3): 50 mg via INTRAVENOUS

## 2012-06-02 MED ORDER — METOPROLOL TARTRATE 1 MG/ML IV SOLN: 2.5000 mg | INTRAVENOUS | Status: DC | PRN

## 2012-06-02 MED ORDER — ALBUMIN HUMAN 5 % IV SOLN
250.0000 mL | INTRAVENOUS | Status: AC | PRN
  Administered 2012-06-03: 250 mL via INTRAVENOUS
  Filled 2012-06-02: qty 250

## 2012-06-02 MED ORDER — MAGNESIUM SULFATE 40 MG/ML IJ SOLN: 4.0000 g | Freq: Once | INTRAMUSCULAR | Status: DC

## 2012-06-02 MED ORDER — SODIUM CHLORIDE 0.9 % IV SOLN: 250.0000 mL | INTRAVENOUS | Status: DC

## 2012-06-02 MED ORDER — MAGNESIUM SULFATE 40 MG/ML IJ SOLN
4.0000 g | Freq: Once | INTRAMUSCULAR | Status: AC
  Administered 2012-06-02: 4 g via INTRAVENOUS
  Filled 2012-06-02: qty 100

## 2012-06-02 MED ORDER — DEXMEDETOMIDINE HCL IN NACL 200 MCG/50ML IV SOLN
0.1000 ug/kg/h | INTRAVENOUS | Status: DC
  Administered 2012-06-02: 0.5 ug/kg/h via INTRAVENOUS
  Filled 2012-06-02: qty 50

## 2012-06-02 MED ORDER — MORPHINE SULFATE 2 MG/ML IJ SOLN: 1.0000 mg | INTRAMUSCULAR | Status: DC | PRN

## 2012-06-02 MED ORDER — CALCIUM CHLORIDE 10 % IV SOLN
1.0000 g | Freq: Once | INTRAVENOUS | Status: AC | PRN
  Filled 2012-06-02: qty 10

## 2012-06-02 MED ORDER — OXYCODONE HCL 5 MG PO TABS: 5.0000 mg | ORAL_TABLET | ORAL | Status: DC | PRN

## 2012-06-02 MED ORDER — 0.9 % SODIUM CHLORIDE (POUR BTL) OPTIME
TOPICAL | Status: DC | PRN
  Administered 2012-06-02: 6000 mL

## 2012-06-02 MED ORDER — SODIUM CHLORIDE 0.9 % IR SOLN
Status: DC | PRN
  Administered 2012-06-02: 1000 mL

## 2012-06-02 MED ORDER — HEMOSTATIC AGENTS (NO CHARGE) OPTIME
TOPICAL | Status: DC | PRN
  Administered 2012-06-02: 1 via TOPICAL

## 2012-06-02 MED ORDER — PANTOPRAZOLE SODIUM 40 MG PO TBEC: 40.0000 mg | DELAYED_RELEASE_TABLET | Freq: Every day | ORAL | Status: DC

## 2012-06-02 MED ORDER — ASPIRIN EC 325 MG PO TBEC: 325.0000 mg | DELAYED_RELEASE_TABLET | Freq: Every day | ORAL | Status: DC

## 2012-06-02 MED ORDER — PANTOPRAZOLE SODIUM 40 MG PO TBEC
40.0000 mg | DELAYED_RELEASE_TABLET | Freq: Every day | ORAL | Status: DC
  Administered 2012-06-04 – 2012-06-07 (×4): 40 mg via ORAL
  Filled 2012-06-02 (×4): qty 1

## 2012-06-02 MED ORDER — METOPROLOL TARTRATE 25 MG/10 ML ORAL SUSPENSION
12.5000 mg | Freq: Two times a day (BID) | ORAL | Status: DC
  Filled 2012-06-02: qty 5

## 2012-06-02 MED ORDER — SODIUM CHLORIDE 0.9 % IV SOLN
INTRAVENOUS | Status: DC
  Filled 2012-06-02: qty 1

## 2012-06-02 MED ORDER — MIDAZOLAM HCL 2 MG/2ML IJ SOLN: 2.0000 mg | INTRAMUSCULAR | Status: DC | PRN

## 2012-06-02 MED ORDER — SODIUM CHLORIDE 0.9 % IR SOLN
Status: DC | PRN
  Administered 2012-06-02 (×2): 3000 mL

## 2012-06-02 MED ORDER — NITROGLYCERIN IN D5W 200-5 MCG/ML-% IV SOLN: 0.0000 ug/min | INTRAVENOUS | Status: DC

## 2012-06-02 MED ORDER — ACETAMINOPHEN 10 MG/ML IV SOLN
1000.0000 mg | Freq: Once | INTRAVENOUS | Status: AC
  Administered 2012-06-02: 1000 mg via INTRAVENOUS

## 2012-06-02 MED ORDER — METOPROLOL TARTRATE 25 MG/10 ML ORAL SUSPENSION
12.5000 mg | Freq: Two times a day (BID) | ORAL | Status: DC
  Filled 2012-06-02 (×3): qty 5

## 2012-06-02 MED ORDER — SODIUM CHLORIDE 0.9 % IV SOLN
INTRAVENOUS | Status: DC
  Administered 2012-06-02: 15:00:00 via INTRAVENOUS

## 2012-06-02 MED ORDER — SODIUM CHLORIDE 0.9 % IV SOLN
INTRAVENOUS | Status: DC
  Administered 2012-06-03: 04:00:00 via INTRAVENOUS

## 2012-06-02 MED ORDER — POTASSIUM CHLORIDE 10 MEQ/50ML IV SOLN: 10.0000 meq | INTRAVENOUS | Status: AC

## 2012-06-02 MED ORDER — SODIUM CHLORIDE 0.9 % IV SOLN
INTRAVENOUS | Status: DC | PRN
  Administered 2012-06-02: 13:00:00 via INTRAVENOUS

## 2012-06-02 MED ORDER — PHENYLEPHRINE HCL 10 MG/ML IJ SOLN
0.0000 ug/min | INTRAVENOUS | Status: DC
  Filled 2012-06-02: qty 2

## 2012-06-02 MED ORDER — ASPIRIN EC 325 MG PO TBEC
325.0000 mg | DELAYED_RELEASE_TABLET | Freq: Every day | ORAL | Status: DC
  Administered 2012-06-03 – 2012-06-07 (×5): 325 mg via ORAL
  Filled 2012-06-02 (×5): qty 1

## 2012-06-02 MED ORDER — BISACODYL 5 MG PO TBEC
10.0000 mg | DELAYED_RELEASE_TABLET | Freq: Every day | ORAL | Status: DC
  Administered 2012-06-03 – 2012-06-06 (×3): 10 mg via ORAL
  Filled 2012-06-02 (×4): qty 2

## 2012-06-02 MED ORDER — FAMOTIDINE IN NACL 20-0.9 MG/50ML-% IV SOLN
20.0000 mg | Freq: Two times a day (BID) | INTRAVENOUS | Status: DC
  Administered 2012-06-02: 20 mg via INTRAVENOUS

## 2012-06-02 MED ORDER — MIDAZOLAM HCL 5 MG/5ML IJ SOLN
INTRAMUSCULAR | Status: DC | PRN
  Administered 2012-06-02: 2 mg via INTRAVENOUS

## 2012-06-02 MED ORDER — DEXMEDETOMIDINE HCL IN NACL 200 MCG/50ML IV SOLN
0.4000 ug/kg/h | INTRAVENOUS | Status: DC
  Filled 2012-06-02: qty 50

## 2012-06-02 MED ORDER — PROTAMINE SULFATE 10 MG/ML IV SOLN
INTRAVENOUS | Status: DC | PRN
  Administered 2012-06-02: 200 mg via INTRAVENOUS

## 2012-06-02 MED ORDER — FENTANYL CITRATE 0.05 MG/ML IJ SOLN
INTRAMUSCULAR | Status: DC | PRN
  Administered 2012-06-02: 50 ug via INTRAVENOUS
  Administered 2012-06-02: 25 ug via INTRAVENOUS
  Administered 2012-06-02: 250 ug via INTRAVENOUS
  Administered 2012-06-02 (×2): 50 ug via INTRAVENOUS
  Administered 2012-06-02 (×2): 250 ug via INTRAVENOUS
  Administered 2012-06-02: 25 ug via INTRAVENOUS
  Administered 2012-06-02: 250 ug via INTRAVENOUS
  Administered 2012-06-02: 50 ug via INTRAVENOUS

## 2012-06-02 MED ORDER — ACETAMINOPHEN 10 MG/ML IV SOLN
1000.0000 mg | Freq: Once | INTRAVENOUS | Status: DC
  Filled 2012-06-02: qty 100

## 2012-06-02 MED ORDER — LACTATED RINGERS IV SOLN: INTRAVENOUS | Status: DC

## 2012-06-02 MED ORDER — SODIUM CHLORIDE 0.9 % IV SOLN
0.5000 g/h | Freq: Once | INTRAVENOUS | Status: DC
  Filled 2012-06-02: qty 20

## 2012-06-02 MED FILL — Magnesium Sulfate Inj 50%: INTRAMUSCULAR | Qty: 10 | Status: AC

## 2012-06-02 MED FILL — Potassium Chloride Inj 2 mEq/ML: INTRAVENOUS | Qty: 40 | Status: AC

## 2012-06-02 SURGICAL SUPPLY — 128 items
ADAPTER CARDIO PERF ANTE/RETRO (ADAPTER) ×3 IMPLANT
ATTRACTOMAT 16X20 MAGNETIC DRP (DRAPES) ×3 IMPLANT
BAG DECANTER FOR FLEXI CONT (MISCELLANEOUS) ×3 IMPLANT
BATTERY PACK STR FOR DRIVER (MISCELLANEOUS) ×3 IMPLANT
BENZOIN TINCTURE PRP APPL 2/3 (GAUZE/BANDAGES/DRESSINGS) ×3 IMPLANT
BLADE SURG 11 STRL SS (BLADE) ×3 IMPLANT
CANISTER SUCTION 2500CC (MISCELLANEOUS) ×6 IMPLANT
CANN STRAIGHTSHOT ART ANGLE (CANNULA) IMPLANT
CANN STRAIGHTSHOT ART STRT (CANNULA) IMPLANT
CANNULA FEM ARTERIAL 21F (MISCELLANEOUS) IMPLANT
CANNULA FEM VENOUS REMOTE 22FR (CANNULA) IMPLANT
CANNULA GUNDRY RCSP 15FR (MISCELLANEOUS) ×3 IMPLANT
CANNULA OPTISITE PERFUSION 16F (CANNULA) ×3 IMPLANT
CANNULA OPTISITE PERFUSION 18F (CANNULA) ×3 IMPLANT
CARDIAC SUCTION (MISCELLANEOUS) ×3 IMPLANT
CATH ENDO VENT PULMONARY (CATHETERS) ×3 IMPLANT
CATH ENDOVENT PULMONARY (CATHETERS) ×3 IMPLANT
CATH ROBINSON RED A/P 18FR (CATHETERS) ×3 IMPLANT
CLOTH BEACON ORANGE TIMEOUT ST (SAFETY) ×3 IMPLANT
CONN ST 1/4X3/8  BEN (MISCELLANEOUS) ×1
CONN ST 1/4X3/8 BEN (MISCELLANEOUS) ×2 IMPLANT
CONNECTOR 1/2X3/8X1/2 3 WAY (MISCELLANEOUS) ×1
CONNECTOR 1/2X3/8X1/2 3WAY (MISCELLANEOUS) ×2 IMPLANT
CONT SPEC 4OZ CLIKSEAL STRL BL (MISCELLANEOUS) ×3 IMPLANT
CONT SPEC STER OR (MISCELLANEOUS) ×3 IMPLANT
COVER MAYO STAND STRL (DRAPES) ×6 IMPLANT
COVER PROBE W GEL 5X96 (DRAPES) ×3 IMPLANT
COVER SURGICAL LIGHT HANDLE (MISCELLANEOUS) ×6 IMPLANT
CRADLE DONUT ADULT HEAD (MISCELLANEOUS) ×3 IMPLANT
DERMABOND ADVANCED (GAUZE/BANDAGES/DRESSINGS) ×2
DERMABOND ADVANCED .7 DNX12 (GAUZE/BANDAGES/DRESSINGS) ×4 IMPLANT
DEVICE TROCAR PUNCTURE CLOSURE (ENDOMECHANICALS) ×3 IMPLANT
DRAIN CHANNEL 28F RND 3/8 FF (WOUND CARE) ×6 IMPLANT
DRAPE BILATERAL SPLIT (DRAPES) ×3 IMPLANT
DRAPE C-ARM 42X72 X-RAY (DRAPES) ×3 IMPLANT
DRAPE CV SPLIT W-CLR ANES SCRN (DRAPES) ×3 IMPLANT
DRAPE INCISE IOBAN 66X45 STRL (DRAPES) ×3 IMPLANT
DRAPE SLUSH/WARMER DISC (DRAPES) IMPLANT
DRSG COVADERM 4X14 (GAUZE/BANDAGES/DRESSINGS) ×3 IMPLANT
DRSG COVADERM 4X8 (GAUZE/BANDAGES/DRESSINGS) ×3 IMPLANT
ELECT BLADE 4.0 EZ CLEAN MEGAD (MISCELLANEOUS) ×3
ELECT BLADE 6.5 EXT (BLADE) ×3 IMPLANT
ELECT REM PT RETURN 9FT ADLT (ELECTROSURGICAL) ×6
ELECTRODE BLDE 4.0 EZ CLN MEGD (MISCELLANEOUS) ×2 IMPLANT
ELECTRODE REM PT RTRN 9FT ADLT (ELECTROSURGICAL) ×4 IMPLANT
FEMORAL VENOUS CANN RAP (CANNULA) IMPLANT
GLOVE BIO SURGEON STRL SZ 6 (GLOVE) ×6 IMPLANT
GLOVE BIO SURGEON STRL SZ7.5 (GLOVE) ×3 IMPLANT
GLOVE BIOGEL PI IND STRL 6 (GLOVE) ×4 IMPLANT
GLOVE BIOGEL PI IND STRL 7.0 (GLOVE) ×8 IMPLANT
GLOVE BIOGEL PI INDICATOR 6 (GLOVE) ×2
GLOVE BIOGEL PI INDICATOR 7.0 (GLOVE) ×4
GLOVE ORTHO TXT STRL SZ7.5 (GLOVE) ×12 IMPLANT
GOWN BRE IMP SLV AUR XL STRL (GOWN DISPOSABLE) ×3 IMPLANT
GOWN STRL NON-REIN LRG LVL3 (GOWN DISPOSABLE) ×18 IMPLANT
GUIDEWIRE ANGLED .035X150CM (WIRE) ×3 IMPLANT
INSERT CONFORM CROSS CLAMP 66M (MISCELLANEOUS) IMPLANT
INSERT CONFORM CROSS CLAMP 86M (MISCELLANEOUS) IMPLANT
KIT BASIN OR (CUSTOM PROCEDURE TRAY) ×3 IMPLANT
KIT DILATOR VASC 18G NDL (KITS) ×3 IMPLANT
KIT DRAINAGE VACCUM ASSIST (KITS) ×3 IMPLANT
KIT FEM CANN BIOMEDICUS 17FR (MISCELLANEOUS) IMPLANT
KIT FEM CANN BIOMEDICUS 19FR (MISCELLANEOUS) IMPLANT
KIT ROOM TURNOVER OR (KITS) ×3 IMPLANT
KIT SUCTION CATH 14FR (SUCTIONS) ×3 IMPLANT
LEAD PACING MYOCARDI (MISCELLANEOUS) ×3 IMPLANT
LINE VENT (MISCELLANEOUS) ×3 IMPLANT
NEEDLE AORTIC ROOT 14G 7F (CATHETERS) ×3 IMPLANT
NS IRRIG 1000ML POUR BTL (IV SOLUTION) ×18 IMPLANT
PACK OPEN HEART (CUSTOM PROCEDURE TRAY) ×3 IMPLANT
PAD ARMBOARD 7.5X6 YLW CONV (MISCELLANEOUS) ×6 IMPLANT
PAD ELECT DEFIB RADIOL ZOLL (MISCELLANEOUS) ×3 IMPLANT
PLATE UNIVERSAL 8 HOLE (Plate) ×3 IMPLANT
RETRACTOR PVL SOFT TISSUE LG (INSTRUMENTS) ×3 IMPLANT
RETRACTOR TRL SOFT TISSUE LG (INSTRUMENTS) IMPLANT
RETRACTOR TRM SOFT TISSUE 7.5 (INSTRUMENTS) IMPLANT
RUBBERBAND STERILE (MISCELLANEOUS) IMPLANT
SCREW SELF TAP MAT 2.9X12MM (Screw) ×6 IMPLANT
SCREW SELF TAP MAT 2.9X14MM (Screw) ×12 IMPLANT
SET CANNULATION TOURNIQUET (MISCELLANEOUS) ×3 IMPLANT
SET CARDIOPLEGIA MPS 5001102 (MISCELLANEOUS) ×3 IMPLANT
SET IRRIG TUBING LAPAROSCOPIC (IRRIGATION / IRRIGATOR) ×3 IMPLANT
SOLUTION ANTI FOG 6CC (MISCELLANEOUS) ×3 IMPLANT
SPONGE GAUZE 4X4 12PLY (GAUZE/BANDAGES/DRESSINGS) ×3 IMPLANT
STRIP CLOSURE SKIN 1/2X4 (GAUZE/BANDAGES/DRESSINGS) ×3 IMPLANT
SUCKER INTRACARDIAC WEIGHTED (SUCKER) ×3 IMPLANT
SUT BONE WAX W31G (SUTURE) ×6 IMPLANT
SUT ETHIBON 2 0 V 52N 30 (SUTURE) ×6 IMPLANT
SUT ETHIBOND 2 0 SH (SUTURE) IMPLANT
SUT ETHIBOND X763 2 0 SH 1 (SUTURE) ×3 IMPLANT
SUT GORETEX CV 4 TH 22 36 (SUTURE) IMPLANT
SUT GORETEX CV4 TH-18 (SUTURE) ×6 IMPLANT
SUT MNCRL AB 3-0 PS2 18 (SUTURE) ×6 IMPLANT
SUT PROLENE 3 0 SH1 36 (SUTURE) ×12 IMPLANT
SUT PROLENE 4 0 RB 1 (SUTURE) ×11
SUT PROLENE 4-0 RB1 .5 CRCL 36 (SUTURE) ×22 IMPLANT
SUT PROLENE 5 0 C 1 36 (SUTURE) ×18 IMPLANT
SUT PROLENE 6 0 C 1 30 (SUTURE) ×9 IMPLANT
SUT SILK  1 MH (SUTURE) ×2
SUT SILK 1 MH (SUTURE) ×4 IMPLANT
SUT SILK 1 TIES 10X30 (SUTURE) ×3 IMPLANT
SUT SILK 2 0 SH CR/8 (SUTURE) ×3 IMPLANT
SUT SILK 2 0 TIES 10X30 (SUTURE) ×3 IMPLANT
SUT SILK 2 0SH CR/8 30 (SUTURE) ×6 IMPLANT
SUT SILK 3 0 (SUTURE) ×1
SUT SILK 3 0 SH CR/8 (SUTURE) ×3 IMPLANT
SUT SILK 3 0SH CR/8 30 (SUTURE) ×6 IMPLANT
SUT SILK 3-0 18XBRD TIE 12 (SUTURE) ×2 IMPLANT
SUT TEM PAC WIRE 2 0 SH (SUTURE) ×6 IMPLANT
SUT VIC AB 2-0 CTX 36 (SUTURE) ×6 IMPLANT
SUT VIC AB 2-0 UR6 27 (SUTURE) ×6 IMPLANT
SUT VIC AB 3-0 SH 8-18 (SUTURE) ×15 IMPLANT
SUT VICRYL 2 TP 1 (SUTURE) ×6 IMPLANT
SYRINGE 10CC LL (SYRINGE) ×3 IMPLANT
SYSTEM SAHARA CHEST DRAIN ATS (WOUND CARE) ×3 IMPLANT
TAPE CLOTH SURG 4X10 WHT LF (GAUZE/BANDAGES/DRESSINGS) ×3 IMPLANT
TAPE PAPER 2X10 WHT MICROPORE (GAUZE/BANDAGES/DRESSINGS) ×3 IMPLANT
TOWEL OR 17X24 6PK STRL BLUE (TOWEL DISPOSABLE) ×3 IMPLANT
TOWEL OR 17X26 10 PK STRL BLUE (TOWEL DISPOSABLE) ×6 IMPLANT
TRAY FOLEY IC TEMP SENS 14FR (CATHETERS) ×3 IMPLANT
TROCAR XCEL BLADELESS 5X75MML (TROCAR) ×3 IMPLANT
TROCAR XCEL NON-BLD 11X100MML (ENDOMECHANICALS) ×6 IMPLANT
TUBE FEEDING 8FR 16IN STR KANG (MISCELLANEOUS) ×3 IMPLANT
TUBE SUCT INTRACARD DLP 20F (MISCELLANEOUS) ×3 IMPLANT
UNDERPAD 30X30 INCONTINENT (UNDERPADS AND DIAPERS) ×3 IMPLANT
VALVE MAGNA EASE AORTIC 23MM (Prosthesis & Implant Heart) ×3 IMPLANT
WATER STERILE IRR 1000ML POUR (IV SOLUTION) ×6 IMPLANT
WIRE BENTSON .035X145CM (WIRE) ×3 IMPLANT

## 2012-06-02 SURGICAL SUPPLY — 79 items
ATTRACTOMAT 16X20 MAGNETIC DRP (DRAPES) ×2 IMPLANT
BAG DECANTER FOR FLEXI CONT (MISCELLANEOUS) IMPLANT
BATTERY PACK STR FOR DRIVER (MISCELLANEOUS) ×4 IMPLANT
CANISTER SUCTION 2500CC (MISCELLANEOUS) ×2 IMPLANT
CATH THORACIC 36FR RT ANG (CATHETERS) ×2 IMPLANT
CLIP FOGARTY SPRING 6M (CLIP) IMPLANT
CLIP TI MEDIUM 24 (CLIP) IMPLANT
CLIP TI WIDE RED SMALL 24 (CLIP) IMPLANT
CLOTH BEACON ORANGE TIMEOUT ST (SAFETY) ×2 IMPLANT
CRADLE DONUT ADULT HEAD (MISCELLANEOUS) ×2 IMPLANT
DERMABOND ADVANCED (GAUZE/BANDAGES/DRESSINGS) ×1
DERMABOND ADVANCED .7 DNX12 (GAUZE/BANDAGES/DRESSINGS) ×1 IMPLANT
DRAPE CARDIOVASCULAR INCISE (DRAPES) ×1
DRAPE SRG 135X102X78XABS (DRAPES) ×1 IMPLANT
DRSG COVADERM 4X14 (GAUZE/BANDAGES/DRESSINGS) ×2 IMPLANT
DRSG COVADERM 4X8 (GAUZE/BANDAGES/DRESSINGS) ×2 IMPLANT
ELECT BLADE 6.5 EXT (BLADE) ×2 IMPLANT
ELECT REM PT RETURN 9FT ADLT (ELECTROSURGICAL) ×2
ELECTRODE REM PT RTRN 9FT ADLT (ELECTROSURGICAL) ×1 IMPLANT
GLOVE ORTHO TXT STRL SZ7.5 (GLOVE) ×4 IMPLANT
GLOVE SURG SS PI 6.5 STRL IVOR (GLOVE) ×6 IMPLANT
GOWN PREVENTION PLUS XLARGE (GOWN DISPOSABLE) ×4 IMPLANT
GOWN STRL NON-REIN LRG LVL3 (GOWN DISPOSABLE) ×6 IMPLANT
HEMOSTAT POWDER SURGIFOAM 1G (HEMOSTASIS) ×4 IMPLANT
INSERT FOGARTY XLG (MISCELLANEOUS) ×2 IMPLANT
KIT BASIN OR (CUSTOM PROCEDURE TRAY) ×2 IMPLANT
KIT ROOM TURNOVER OR (KITS) ×2 IMPLANT
KIT SUCTION CATH 14FR (SUCTIONS) ×4 IMPLANT
LEAD PACING MYOCARDI (MISCELLANEOUS) ×2 IMPLANT
NS IRRIG 1000ML POUR BTL (IV SOLUTION) ×6 IMPLANT
PACK OPEN HEART (CUSTOM PROCEDURE TRAY) ×2 IMPLANT
PAD ARMBOARD 7.5X6 YLW CONV (MISCELLANEOUS) ×4 IMPLANT
SET IRRIG TUBING LAPAROSCOPIC (IRRIGATION / IRRIGATOR) ×2 IMPLANT
SOLUTION ANTI FOG 6CC (MISCELLANEOUS) ×2 IMPLANT
SPONGE GAUZE 4X4 12PLY (GAUZE/BANDAGES/DRESSINGS) ×2 IMPLANT
SPONGE LAP 18X18 X RAY DECT (DISPOSABLE) IMPLANT
SPONGE LAP 4X18 X RAY DECT (DISPOSABLE) IMPLANT
STOPCOCK 4 WAY LG BORE MALE ST (IV SETS) IMPLANT
SURGIFLO TRUKIT (HEMOSTASIS) ×2 IMPLANT
SUT BONE WAX W31G (SUTURE) ×2 IMPLANT
SUT ETHIBOND 2 0 SH (SUTURE)
SUT ETHIBOND 2 0 SH 36X2 (SUTURE) IMPLANT
SUT ETHIBOND X763 2 0 SH 1 (SUTURE) ×4 IMPLANT
SUT GORETEX CV-5 36 IN (SUTURE) ×2 IMPLANT
SUT GORETEX CV4 TH-18 (SUTURE) ×4 IMPLANT
SUT MNCRL AB 3-0 PS2 18 (SUTURE) ×2 IMPLANT
SUT MNCRL AB 4-0 PS2 18 (SUTURE) IMPLANT
SUT MON AB 4-0 P3 18 (SUTURE) IMPLANT
SUT PDS AB 1 CTX 36 (SUTURE) ×4 IMPLANT
SUT PROLENE 3 0 SH 1 (SUTURE) IMPLANT
SUT PROLENE 4 0 RB 1 (SUTURE)
SUT PROLENE 4-0 RB1 .5 CRCL 36 (SUTURE) IMPLANT
SUT PROLENE 5 0 C 1 36 (SUTURE) IMPLANT
SUT PROLENE 6 0 C 1 30 (SUTURE) IMPLANT
SUT PROLENE 7 0 BV 1 (SUTURE) IMPLANT
SUT PROLENE 7 0 BV1 MDA (SUTURE) IMPLANT
SUT PROLENE 8 0 BV175 6 (SUTURE) IMPLANT
SUT SILK  1 MH (SUTURE) ×4
SUT SILK 1 MH (SUTURE) ×4 IMPLANT
SUT SILK 2 0SH CR/8 30 (SUTURE) ×4 IMPLANT
SUT STEEL 6MS V (SUTURE) IMPLANT
SUT STEEL STERNAL CCS#1 18IN (SUTURE) IMPLANT
SUT STEEL SZ 6 DBL 3X14 BALL (SUTURE) IMPLANT
SUT VIC AB 2-0 CT1 36 (SUTURE) IMPLANT
SUT VIC AB 2-0 CTX 27 (SUTURE) IMPLANT
SUT VIC AB 3-0 SH 27 (SUTURE)
SUT VIC AB 3-0 SH 27X BRD (SUTURE) IMPLANT
SUT VIC AB 3-0 SH 8-18 (SUTURE) ×2 IMPLANT
SUT VICRYL 2 TP 1 (SUTURE) ×2 IMPLANT
SUT VICRYL 4-0 PS2 18IN ABS (SUTURE) IMPLANT
SUTURE E-PAK OPEN HEART (SUTURE) ×2 IMPLANT
SYSTEM SAHARA CHEST DRAIN ATS (WOUND CARE) ×4 IMPLANT
TAPE CLOTH SURG 4X10 WHT LF (GAUZE/BANDAGES/DRESSINGS) ×2 IMPLANT
TAPE PAPER 2X10 WHT MICROPORE (GAUZE/BANDAGES/DRESSINGS) ×2 IMPLANT
TOWEL OR 17X24 6PK STRL BLUE (TOWEL DISPOSABLE) ×2 IMPLANT
TOWEL OR 17X26 10 PK STRL BLUE (TOWEL DISPOSABLE) ×2 IMPLANT
TRAY CATH LUMEN 1 20CM STRL (SET/KITS/TRAYS/PACK) IMPLANT
UNDERPAD 30X30 INCONTINENT (UNDERPADS AND DIAPERS) ×2 IMPLANT
WATER STERILE IRR 1000ML POUR (IV SOLUTION) ×4 IMPLANT

## 2012-06-02 NOTE — Op Note (Addendum)
CARDIOTHORACIC SURGERY OPERATIVE NOTE  Date of Procedure:  06/02/2012  Preoperative Diagnosis: Severe Aortic Stenosis   Postoperative Diagnosis: Same   Procedure:    Minimally Invasive Aortic Valve Replacement  Edwards Magna Ease Pericardial Tissue Valve (size 23mm, model #[id number], serial #[id number])   Surgeon: Salvatore Decent. Cornelius Moras, MD  Assistant: Gershon Crane, PA-C  Anesthesia: Kipp Brood, MD   Operative Findings:  Bicuspid native aortic valve with severe aortic stenosis  Normal left ventricular systolic function          BRIEF CLINICAL NOTE AND INDICATIONS FOR SURGERY  Patient is a 61 year old married white male from [place] with known history of bicuspid aortic valve with aortic stenosis who has been referred to consider elective aortic valve replacement. The patient states that he was first noted to have a heart murmur on routine physical exam more than 15 years ago by his primary care physician. He was referred to Dr. [name] and found to have bicuspid aortic valve with mild aortic stenosis. He has been followed intermittently ever since on a regular basis.  The patient first began developing symptoms of chest pain more than a year ago. Initially these symptoms only occurred when the patient was running or doing something very strenuous. Over the past 4-6 weeks the patient has developed significant acceleration of symptoms of exertional shortness of breath and fatigue. He was seen in followup by Dr. [name] in early October and an echocardiogram was performed demonstrating significant progression of disease with severe aortic stenosis including the velocity across the valve measured 419 cm/s corresponding to peak and mean transvalvular gradients of 70 and 42 mm mercury respectively. Left ventricular systolic function remains well preserved. The patient subsequently underwent left and right heart catheterization by Dr. [name] yesterday.  These confirmed the presence of  severe aortic stenosis with a mean transvalvular gradient measured at cath 48 mm mercury corresponding to a aortic valve area estimated 0.6 cm. The patient was referred to consider elective aortic valve replacement.  The patient has been seen in consultation and counseled at length regarding the indications, risks and potential benefits of surgery.  All questions have been answered, and the patient provides full informed consent for the operation as described.    DETAILS OF THE OPERATIVE PROCEDURE  The patient is brought to the operating room on the above mentioned date and central monitoring was established by the anesthesia team including placement of Swan-Ganz catheter through the left internal jugular vein.  A radial arterial line is placed. The patient is placed in the supine position on the operating table.  Intravenous antibiotics are administered. General endotracheal anesthesia is induced uneventfully. The patient is initially intubated using a dual lumen endotracheal tube.  A Foley catheter is placed.  Baseline transesophageal echocardiogram was performed.  Findings were notable for bicuspid native aortic valve with fusion of the left and right cusps of the valve.  There was severe aortic stenosis and no aortic insufficiency.  There was normal LV size and systolic function.  There were no other abnormalities.  The patient is placed in the supine position with their neck gently extended and turned to the left.   The patient's right neck, chest, abdomen, both groins, and both lower extremities are prepared and draped in a sterile manner. A time out procedure is performed.  A small incision is made in the right inguinal crease and the anterior surface of the right common femoral artery and right common femoral vein are identified.  A right miniature anteriorthoracotomy incision  is performed. The incision is placed immediately over the third costal cartilage.  The muscle fibers of the pectoralis  major muscle are split longitudinally and the right pleural space is entered through the second intercostal space. The third costal cartilage is divided at it's insertion onto the sternum.  The right internal mammary artery and veins are ligated and divided.  A soft tissue retractor is placed.  Two 11 mm ports are placed through separate stab incisions inferiorly. The right pleural space is insufflated continuously with carbon dioxide gas through the posterior port during the remainder of the operation.    An incision is made in the pericardium over the ascending aorta and extended in both directions. Silk traction sutures are placed to retract the pericardium.  Due to inadequate exposure of the distal portion of the ascending aorta, the second costal cartilage is divided at it's insertion onto the sternum.  The patient is placed in Trendelenburg position. The right internal jugular vein is cannulated with Seldinger technique and a guidewire advanced into the right atrium. An Edwards Endovent introducing sheath is placed into the right internal jugular vein, and the Endovent catheter passed through the sheath and floated into the main pulmonary artery.  A retrograde cardioplegia cannula is placed through the right atrium into the coronary sinus using transesophageal echocardiogram guidance.  Pursestring sutures are placed on the anterior surface of the right common femoral vein and right common femoral artery. The right common femoral vein is cannulated with the Seldinger technique and a guidewire is advanced under transesophageal echocardiogram guidance through the right atrium. The femoral vein is cannulated with a long 22 French femoral venous cannula. The right common femoral artery is cannulated with Seldinger technique and a flexible guidewire is advanced until it can be appreciated intraluminally in the descending thoracic aorta on transesophageal echocardiogram. The femoral artery is cannulated with an  18 French femoral arterial cannula.  Adequate heparinization is verified.      The entire pre-bypass portion of the operation was notable for stable hemodynamics.  Cardiopulmonary bypass was begun.  Vacuum assist venous drainage is utilized.  Venous drainage and exposure are notably excellent.  An antegrade cardioplegia cannula is placed in the ascending aorta.    The patient is cooled to 28C systemic temperature.  The aortic cross clamp is applied and cold blood cardioplegia is delivered initially in an antegrade fashion through the aortic root.  Supplemental cardioplegia is given retrograde through the coronary sinus catheter. The initial cardioplegic arrest is rapid with early diastolic arrest.  Repeat doses of cardioplegia are administered intermittently every 20 to 30 minutes throughout the entire cross clamp portion of the operation through the coronary sinus catheter in order to maintain completely flat electrocardiogram.  Myocardial protection was felt to be excellent.  An oblique transverse aortotomy incision was performed.  The aortic valve was inspected and notably bicuspid with fusion of the left and right cusps of the valve.  There was severe aortic stenosis.  The left main and the right coronary artery were in the normal anatomical position.  The aortic valve leaflets were excised sharply and the aortic annulus decalcified.  Decalcification was notably straightforward.  The aortic annulus was sized to accept a 23 mm prosthesis.  The aortic root and left ventricle were irrigated with copious cold saline solution.  Aortic valve replacement was performed using interrupted horizontal mattress 2-0 Ethibond pledgeted sutures with pledgets in the subannular position.  An [facility] Ease pericardial tissue valve (size 23 mm,  model # [id number], serial # [id number]) was implanted uneventfully. The valve seated appropriately with adequate space beneath the left main and right coronary artery.  The  aortotomy was closed using a 2-layer closure of running 4-0 Prolene suture.  One final dose of warm retrograde "hot shot" cardioplegia was administered retrograde through the coronary sinus catheter while all air was evacuated through the aortic root.  The aortic cross clamp was removed after a total cross clamp time of 102 minutes.  Epicardial pacing wires are fixed to the right ventricular outflow tract and to the right atrial appendage. The patient is rewarmed to 37C temperature.  The pericardial sac was drained using a 28 French Bard drain placed through the anterior port incision.   The patient is weaned and disconnected from cardiopulmonary bypass.  The patient's rhythm at separation from bypass was normal sinus.  The patient was weaned from bypass without any inotropic support. Total cardiopulmonary bypass time for the operation was 141 minutes.  Followup transesophageal echocardiogram performed after separation from bypass revealed a well-seated bioprosthetic tissue valve in the aortic position with no perivalvular leak.  Left ventricular function was unchanged from preoperatively.    The femoral arterial and venous cannulae were removed uneventfully. There was a palpable pulse in the distal right common femoral artery after removal of the cannula.  The Endovent catheter was removed.   Protamine was administered to reverse the anticoagulation.   Single lung ventilation was begun. The aortotomy closure was inspected for hemostasis.  The pericardium was closed using a patch of core matrix bovine submucosal tissue patch. The right pleural space is irrigated with saline solution and inspected for hemostasis. The right pleural space was drained using a 28 French Bard drain placed through the posterior port incision.   The second and third costal cartilages were reattached to the sternum each using a small Synthes plate.  The miniature thoracotomy incision was closed in multiple layers in routine fashion.  The right groin incision was inspected for hemostasis and closed in multiple layers in routine fashion.  The patient tolerated the procedure well.  The patient was reintubated using a single lumen endotracheal tube and subsequently transported to the surgical intensive care unit in stable condition. There were no intraoperative complications. All sponge instrument and needle counts are verified correct at completion of the operation.   The post-bypass portion of the operation was notable for stable rhythm and hemodynamics.   No blood products were administered during the operation.    Salvatore Decent. Cornelius Moras MD 06/02/2012 2:12 PM

## 2012-06-02 NOTE — Anesthesia Postprocedure Evaluation (Signed)
  Anesthesia Post-op Note  Patient: John Boyd  Procedure(s) Performed: Procedure(s) (LRB) with comments: MINIMALLY INVASIVE AORTIC VALVE REPLACEMENT (AVR) (N/A) INTRAOPERATIVE TRANSESOPHAGEAL ECHOCARDIOGRAM (N/A)  Patient Location: SICU  Anesthesia Type:General  Level of Consciousness: sedated and Patient remains intubated per anesthesia plan  Airway and Oxygen Therapy: Patient remains intubated per anesthesia plan and Patient placed on Ventilator (see vital sign flow sheet for setting)  Post-op Pain: none  Post-op Assessment: Post-op Vital signs reviewed and Patient's Cardiovascular Status Stable  Post-op Vital Signs: stable  Complications: No apparent anesthesia complications

## 2012-06-02 NOTE — Significant Event (Signed)
1730pm-Pt taken back to OR for continued bleeding from chest tubes with total output of 850cc since admission to SICU. Since in SICU, pt has received emergently a unit of plasma, 2 units of platelets, and with one unit of platelet still infusing in route back to OR. Pt family at bedside and have updated by MD Cornelius Moras and RN.

## 2012-06-02 NOTE — Addendum Note (Signed)
Addendum  created 06/02/12 2138 by Kipp Brood, MD   Modules edited:Notes Section

## 2012-06-02 NOTE — Anesthesia Procedure Notes (Signed)
Date/Time: 06/02/2012 5:45 PM Performed by: Angelica Pou Pre-anesthesia Checklist: Patient identified, Emergency Drugs available, Timeout performed, Suction available and Patient being monitored Patient Re-evaluated:Patient Re-evaluated prior to inductionOxygen Delivery Method: Circle system utilized Preoxygenation: Pre-oxygenation with 100% oxygen Intubation Type: Combination inhalational/ intravenous induction Laryngoscope Size: Mac and 3 Endobronchial tube: Left, EBT position confirmed by auscultation, Double lumen EBT and EBT position confirmed by fiberoptic bronchoscope and 37 Fr Number of attempts: 1 Placement Confirmation: ETT inserted through vocal cords under direct vision,  breath sounds checked- equal and bilateral and positive ETCO2 Tube secured with: Tape Dental Injury: Teeth and Oropharynx as per pre-operative assessment  Comments: SIVI/SIHI, Pre-O2. DLx1 with Mac 3, Cook catheter tube exchanger advanced, Parker ETT removed, DLT advanced smoothly over Gallup catheter. +etCO2, BS=B. Cook Catheter removed. Placement confirmed with auscultation and fiberoptic.

## 2012-06-02 NOTE — Transfer of Care (Signed)
Immediate Anesthesia Transfer of Care Note  Patient: John Boyd  Procedure(s) Performed: Procedure(s) (LRB) with comments: EXPLORATION POST OPERATIVE OPEN HEART (N/A)  Patient Location: SICU  Anesthesia Type:General  Level of Consciousness: sedated and unresponsive  Airway & Oxygen Therapy: Patient remains intubated per anesthesia plan and Patient placed on Ventilator (see vital sign flow sheet for setting)  Post-op Assessment: Report given to PACU RN and Post -op Vital signs reviewed and stable  Post vital signs: Reviewed and stable  Complications: No apparent anesthesia complications

## 2012-06-02 NOTE — OR Nursing (Signed)
06-02-12 - One 8- hole universal plate used, also used two 12mm screws, and four 14mm screws in chest cartilage

## 2012-06-02 NOTE — Preoperative (Signed)
Beta Blockers   Reason not to administer Beta Blockers:Not Applicable 

## 2012-06-02 NOTE — Progress Notes (Signed)
[facility] BRIEF SICU PROGRESS NOTE  Day of Surgery  S/P Procedure(s) (LRB): MINIMALLY INVASIVE AORTIC VALVE REPLACEMENT (AVR) (N/A) INTRAOPERATIVE TRANSESOPHAGEAL ECHOCARDIOGRAM (N/A)   Mr Cronkright has remained completely stable but chest tube output has been excessive despite fairly normal blood coag parameters.   Plan: Will return to OR for reexploration.  Discussed with patient's wife and daughter at bedside.  OWEN,CLARENCE H 06/02/2012 4:56 PM

## 2012-06-02 NOTE — H&P (Signed)
CARDIOTHORACIC SURGERY HISTORY AND PHYSICAL EXAM  Referring Provider is [name], MD PCP is [name], MD    Chief Complaint   Patient presents with   .  Aortic Stenosis       eval for AVR     HPI:  Patient is a 61 year old married white male from [place] with known history of bicuspid aortic valve with aortic stenosis who has been referred to consider elective aortic valve replacement. The patient states that he was first noted to have a heart murmur on routine physical exam more than 15 years ago by his primary care physician. He was referred to Dr. [name] and found to have bicuspid aortic valve with mild aortic stenosis. He has been followed intermittently ever since on a regular basis.  The patient first began developing symptoms of chest pain more than a year ago. Initially these symptoms only occurred when the patient was running or doing something very strenuous. Over the past 4-6 weeks the patient has developed significant acceleration of symptoms of exertional shortness of breath and fatigue. He was seen in followup by Dr. [name] in early October and an echocardiogram was performed demonstrating significant progression of disease with severe aortic stenosis including the velocity across the valve measured 419 cm/s corresponding to peak and mean transvalvular gradients of 70 and 42 mm mercury respectively. Left ventricular systolic function remains well preserved. The patient subsequently underwent left and right heart catheterization by Dr. [name] yesterday.  These confirmed the presence of severe aortic stenosis with a mean transvalvular gradient measured at cath 48 mm mercury corresponding to a aortic valve area estimated 0.6 cm. Following catheterization the patient had a brief transient visual disturbance for which he underwent a brain CT scan and was seen in consultation by Dr. [name] from the neurology team.  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 The patient was referred to consider elective aortic valve replacement.  The patient reports a 4 to 6 week history of symptoms of significant exertional shortness of breath. He denies resting shortness of breath, PND, orthopnea, or lower extremity edema. He has had occasional dizzy spells without syncope. The patient still has episodes of chest tightness when he is running uphill which she has had for more than a year. He reports occasional fleeting episodes of chest pain at rest it seemed to come and goes sporadically.    Past Medical History  Diagnosis Date  . Hypertension     x16 years  . Hyperlipidemia   . Bicuspid aortic valve   . Migraine   . Aortic stenosis 05/06/2012  . Hematuria     Past Surgical History  Procedure Date  . Hip arthroscopy     left hip  . Cardiac catheterization   . Closed manipulation shoulder   . Cystoscopy     Family History  Problem Relation Age of Onset  . Coronary artery disease Neg Hx     Early  . Sudden death Neg Hx   . Heart disease Mother   . Heart disease Paternal Grandfather     Social History History  Substance Use Topics  . Smoking status: Never Smoker   . Smokeless tobacco: Never Used  . Alcohol Use: No    Prior to Admission medications   Medication Sig Start Date End Date Taking? Authorizing Provider  aspirin 81 MG tablet Take 81 mg by mouth daily.  Yes Historical Provider, MD  cholecalciferol (VITAMIN D) 400 UNITS TABS Take 400 Units by mouth daily.   Yes Historical Provider, MD  desonide (DESOWEN) 0.05 % cream Apply 1 application topically 2 (two) times daily as needed. rash   Yes Historical Provider, MD  fluticasone (FLONASE) 50 MCG/ACT nasal spray Place 1 spray into the nose daily as needed. Nasal allergies   Yes Historical Provider, MD  lisinopril (PRINIVIL,ZESTRIL) 20 MG tablet Take 10 mg by mouth daily.   Yes Historical Provider, MD  multivitamin [facility])  per tablet Take 1 tablet by mouth daily.     Yes Historical Provider, MD    No Known Allergies   Review of Systems:              General:                      normal appetite, decreased energy, no weight gain, no weight loss, no fever             Cardiac:                      + chest pain with exertion, occasional fleeting chest pain at rest, + SOB with mild-moderate exertion, no resting SOB, no PND, no orthopnea, occasional palpitations, no arrhythmia, no atrial fibrillation, no LE edema, occasional dizzy spells, no syncope             Respiratory:                + exertional shortness of breath, no home oxygen, no productive cough, no dry cough, no bronchitis, no wheezing, no hemoptysis, no asthma, no pain with inspiration or cough, no sleep apnea, no CPAP at night             GI:                                no difficulty swallowing, no reflux, no frequent heartburn, no hiatal hernia, no abdominal pain, no constipation, no diarrhea, no hematochezia, no hematemesis, no melena             GU:                              no dysuria,  no frequency, no urinary tract infection, + microscopic hematuria, no enlarged prostate, no kidney stones, no kidney disease             Vascular:                     no pain suggestive of claudication, no pain in feet, no leg cramps, no varicose veins, no DVT, no non-healing foot ulcer             Neuro:                         no stroke, no TIA's, no seizures, + headaches, + temporary blindness one eye,  no slurred speech, no peripheral neuropathy, no chronic pain, no instability of gait, no memory/cognitive dysfunction             Musculoskeletal:         no arthritis, no joint swelling, no myalgias, no difficulty walking, normal mobility  Skin:                            no rash, no itching, no skin infections, no pressure sores or ulcerations             Psych:                         no anxiety, no depression, no nervousness, no unusual recent  stress             Eyes:                           no blurry vision, no floaters, + recent vision changes, no wears glasses or contacts             ENT:                            no hearing loss, no loose or painful teeth, no dentures, last saw dentist within 6 months             Hematologic:               no easy bruising, no abnormal bleeding, no clotting disorder, no frequent epistaxis             Endocrine:                   no diabetes, does not check CBG's at home                           Physical Exam:              BP 115/76  Pulse 67  Temp 97.5 F (36.4 C) (Oral)  Resp 18  Ht 5\' 10"  (1.778 m)  Wt 165 lb (74.844 kg)  BMI 23.68 kg/m2  SpO2 98%             General:                      Healthy,  well-appearing             HEENT:                       Unremarkable               Neck:                           no JVD, no bruits, no adenopathy               Chest:                         clear to auscultation, symmetrical breath sounds, no wheezes, no rhonchi               CV:                              RRR, grade IV/VI crescendo/decrescendo systolic murmur               Abdomen:  soft, non-tender, no masses               Extremities:                 warm, well-perfused, pulses palpable, no LE edema             Rectal/GU                   Deferred             Neuro:                         Grossly non-focal and symmetrical throughout             Skin:                            Clean and dry, no rashes, no breakdown   Diagnostic Tests:  Transthoracic Echocardiography  Patient: Tannar, Broker MR #: [id number] Study Date: 03/18/2012 Gender: M Age: 89 Height: 177.8cm Weight: 74.4kg BSA: 1.49m^2 Pt. Status: Room:  ATTENDING Hochrein, Anselm Pancoast REFERRING Hochrein, Fayrene Fearing PERFORMING [facility], [facility] SONOGRAPHER Philomena Course, RDCS cc:  ------------------------------------------------------------ LV EF: 60% -  65%  ------------------------------------------------------------ Indications: Aortic stenosis 424.1.  ------------------------------------------------------------ History: PMH: Acquired from the patient and from the patient's chart. Mild aortic stenosis. Risk factors: Hypertension. Dyslipidemia.  ------------------------------------------------------------ Study Conclusions  - Left ventricle: The cavity size was normal. Wall thickness was increased in a pattern of mild LVH. Systolic function was normal. The estimated ejection fraction was in the range of 60% to 65%. - Aortic valve: AV is thickened, calcified with restricted motion. Peak and mean gradients through the valve are 70 and 42 mm Hg respectively consistent with severe AS> Transthoracic echocardiography. M-mode, complete 2D, spectral Doppler, and color Doppler. Height: Height: 177.8cm. Height: 70in. Weight: Weight: 74.4kg. Weight: 163.7lb. Body mass index: BMI: 23.5kg/m^2. Body surface area: BSA: 1.36m^2. Blood pressure: 108/76. Patient status: Outpatient. Location: [facility]  ------------------------------------------------------------  ------------------------------------------------------------ Left ventricle: The cavity size was normal. Wall thickness was increased in a pattern of mild LVH. Systolic function was normal. The estimated ejection fraction was in the range of 60% to 65%.  ------------------------------------------------------------ Aortic valve: AV is thickened, calcified with restricted motion. Peak and mean gradients through the valve are 70 and 42 mm Hg respectively consistent with severe AS> Doppler: VTI ratio of LVOT to aortic valve: 0.25. Indexed valve area: 0.45cm^2/m^2 (VTI). Peak velocity ratio of LVOT to aortic valve: 0.25. Indexed valve area: 0.45cm^2/m^2 (Vmax). Mean gradient: 42mm Hg (S). Peak gradient: 70mm Hg  (S).  ------------------------------------------------------------ Mitral valve: Mildly thickened leaflets . Doppler: Trivial regurgitation. Peak gradient: 2mm Hg (D).  ------------------------------------------------------------ Left atrium: The atrium was normal in size.  ------------------------------------------------------------ Right ventricle: The cavity size was normal. Wall thickness was normal. Systolic function was normal.  ------------------------------------------------------------ Tricuspid valve: Structurally normal valve. Leaflet separation was normal. Doppler: Transvalvular velocity was within the normal range. Trivial regurgitation.  ------------------------------------------------------------ Right atrium: The atrium was normal in size.  ------------------------------------------------------------ Pericardium: There was no pericardial effusion.  ------------------------------------------------------------  2D measurements Normal Doppler measurements Norma Left ventricle l LVID ED, 34.2 mm 43-52 Main pulmonary artery chord, Pressure, 27 mm Hg =30 PLAX S LVID ES, 22 mm 23-38 Left ventricle chord, Ea, lat 14. cm/s ----- PLAX ann, tiss 5 FS, chord, 36 % >29 DP PLAX E/Ea, lat 5.4 ----- LVPW,  ED 14.1 mm ------ ann, tiss IVS/LVPW 1.04 <1.3 DP ratio, ED Ea, med 8.0 cm/s ----- Ventricular septum ann, tiss 1 IVS, ED 14.7 mm ------ DP LVOT E/Ea, med 9.7 ----- Diam, S 21 mm ------ ann, tiss 8 Area 3.46 cm^2 ------ DP Diam 21 mm ------ LVOT Aorta Peak vel, 104 cm/s ----- Root diam, 29 mm ------ S ED VTI, S 26. cm ----- Left atrium 3 AP dim 33 mm ------ HR 55 bpm ----- AP dim 1.72 cm/m^2 <2.2 Stroke vol 91. ml ----- index 1 Cardiac 5 L/min ----- output Cardiac 2.6 L/(min-m ----- index ^2) Stroke 47. ml/m^2 ----- index 4 Aortic valve Peak vel, 419 cm/s ----- S Mean vel, 301 cm/s ----- S VTI, S 105 cm ----- Mean 42 mm Hg ----- gradient, S Peak 70  mm Hg ----- gradient, S VTI ratio 0.2 ----- LVOT/AV 5 Area index 0.4 cm^2/m^2 ----- (VTI) 5 Peak vel 0.2 ----- ratio, 5 LVOT/AV Area index 0.4 cm^2/m^2 ----- (Vmax) 5 Mitral valve Peak E vel 78. cm/s ----- 3 Peak A vel 58. cm/s ----- 6 Decelerati 204 ms 150-2 on time 30 Peak 2 mm Hg ----- gradient, D Peak E/A 1.3 ----- ratio Tricuspid valve Regurg 237 cm/s ----- peak vel Peak RV-RA 22 mm Hg ----- gradient, S Systemic veins Estimated 5 mm Hg ----- CVP Right ventricle Pressure, 27 mm Hg <30 S Sa vel, 13. cm/s ----- lat ann, 5 tiss DP  ------------------------------------------------------------ Prepared and Electronically Authenticated by  Dietrich Pates [id number]:[id number]    Cardiac Catheterization Procedure Note    Name: REGIS HINTON   MRN: [id number]   DOB: 12/20/1950   Procedure: Right Heart Cath, Left Heart Cath, Selective Coronary Angiography, LV angiography   Indication: Aortic stenosis.   Procedural Details: The right groin was prepped, draped, and anesthetized with 1% lidocaine. Using the modified Seldinger technique a 5 French sheath was placed in the right femoral artery and a 7 French sheath was placed in the right femoral vein. A Swan-Ganz catheter was used for the right heart catheterization. Standard protocol was followed for recording of right heart pressures and sampling of oxygen saturations. Fick cardiac output was calculated. Standard Judkins catheters were used for selective coronary angiography and left ventriculography. There were no immediate procedural complications. The patient was transferred to the post catheterization recovery area for further monitoring.   Procedural Findings:   Hemodynamics:   RA 1  RV 24/5  PA 15/4 Mean 9   PCWP 3  LV 146/15  AO 103/86  AO Valve area 0.6  AO Gradient mean 47.93   Oxygen saturations:   PA 70  AO 97  Cardiac Output (Fick) 4.1 Cardiac Index (Fick) 2.1  Coronary angiography:   Coronary dominance: right   Left mainstem: Normal   Left anterior descending (LAD): Large and wrapping the apex. Normal. Large D1 normal.  Left circumflex (LCx): AV groove normal. RI moderate sized and normal. OM large and normal.   Right coronary artery (RCA): Large and dominant. PDA is moderate sized and normal. PL x 2 are small to moderate sized and normal.   Left ventriculography: Left ventricular systolic function is normal, LVEF is estimated at 55-65%, there is no significant mitral regurgitation   Final Conclusions: Severe aortic stenosis. Normal coronaries and LV function.   Recommendations: The patient will be referred for AVR. Of note he complained of a possible visual field defect immediately after the procedure. There were no other neurologic deficits and the visual complaint did  seem to improve over a few minutes. I called the neurology service who is evaluating the patient for possible embolic CVA.   [name]   05/05/2012, 11:16 AM     CT ANGIOGRAPHY OF THE HEART, CORONARY ARTERY, STRUCTURE, AND MORPHOLOGY   COMPARISON:  None   CONTRAST: 80mL OMNIPAQUE IOHEXOL 350 MG/ML SOLN   TECHNIQUE:  CT angiography of the coronary vessels was performed on a 256 channel system using prospective ECG gating.  A scout and ECG- gated noncontrast exam (for calcium scoring) were performed. Appropriate delay was determined by bolus tracking after injection of iodinated contrast, and an ECG-gated coronary CTA was performed with sub-mm slice collimation.  Imaging post processing was performed on an independent workstation creating multiplanar and 3- D images, allowing for quantitative analysis of the heart and coronary arteries.  Note that this exam targets the heart and the chest was not imaged in its entirety.   PREMEDICATION: Lopressor  50 mg, P.O.   FINDINGS: Technical quality: Excellent. Heart rate:  52.   CORONARY ARTERIES: Left Main:              Negative. LAD:               Minimal calcified atherosclerotic plaque proximally with no associated stenosis. Diagonals:              Large-caliber D1, small D2 and D3. Negative. Ramus int:              Negative. Cx:               Negative. OMs:              Large OM1.  Negative. RCA:              Minimal calcified atherosclerotic proximally with no associated stenosis. PDA:                    Negative. Dominance:        Right.   CORONARY CALCIUM: Total Agatston Score:         35 MESA database percentile:     50th   AORTIC ROOT:   Aortic Valve Description:  Functionally bicuspid aortic valve with a median raphe between fused left and right cusps.  Independent noncoronary cusp.  Severe thickening and calcification of the valve with limited aortic valve area during systole (estimated at 0.65 cm2 by planimetry).  Notably, there appears to be some calcium which impinges upon the orifice during various portions of systole, and this calcified area was excluded in the planimetry measurement which likely overestimates the severity of stenosis as this calcium appeared to be relatively mobile.   Aortic Valve Area: 0.65 cm-sq   Aortic Annulus (systolic measurents):         Long-axis:  29.3 mm         Short-axis:  22.4 mm         Cross-sectional area:  4.96 cm-sq         Circumference:  78.1 mm   Sinuses of Valsalva (diastolic measurements):         L-SOV -     Width:  31.3 mm         R-SOV -     Width:  30.0 mm         Pavillion-SOV -    Width:  33.0 mm   ADDITIONAL AORTA AND PULMONARY MEASUREMENTS:   Aortic root (21 - 40 mm):  26.8 mm at the sinotubular junction                                                                 Ascending aorta: (< 40 mm):         36 mm                                 Descending aorta:  (< 40 mm):       22 mm                                 Main pulmonary artery: (< 30 mm):   18 mm                                 OTHER FINDINGS: Calcified granuloma in the right lower  lobe larger more suspicious appearing pulmonary nodules or masses within the visualized thorax. No acute consolidative airspace disease or pleural effusions. Visualized portions of the upper abdomen are unremarkable There are no aggressive appearing lytic or blastic lesions noted in the visualized portions of the skeleton.   IMPRESSION: 1.  Severely sclerotic functionally bicuspid aortic valve (fusion of left and right cusps) severe aortic stenosis as detailed above. Aortic root measurements, as above. Notably, there is no associated thoracic aortic aneurysm at this time. 2.  Mild nonobstructive coronary artery disease, as above.  The patient's total coronary artery calcium score is 35 which is 50th percentile for patient's of matched age, gender and race/ethnicity. Assessment for potential risk factor modification, dietary therapy or pharmacologic therapy may be warranted, if clinically indicated. 3.  Right coronary artery dominance.   Original Report Authenticated By: Trudie Reed, M.D.   CT ANGIOGRAPHY ABDOMEN AND PELVIS   Technique:  Multidetector CT imaging of the abdomen and pelvis was performed using the standard protocol during bolus administration of intravenous contrast.  Multiplanar reconstructed images including MIPs were obtained and reviewed to evaluate the vascular anatomy.   Contrast: 80mL OMNIPAQUE IOHEXOL 350 MG/ML SOLN   Comparison:  CT of the abdomen and pelvis 07/01/2010.   Findings:   Lung Bases: Unremarkable.   Abdomen/Pelvis:  Mild atherosclerosis is noted throughout the abdominal and pelvic vasculature, without evidence of aneurysm, dissection or major branch vessel occlusion.  The infrarenal abdominal aorta measures 1.6 cm in diameter shortly beneath the origin of the inferior mesenteric artery (its smallest point). Common iliac arteries measure 7 mm in diameter bilaterally. External iliac arteries measure 6 mm in diameter bilaterally.   The  enhanced appearance of the liver, gallbladder, pancreas, spleen and bilateral adrenal glands is unremarkable.  There are multiple subcentimeter low attenuation lesions in the kidneys bilaterally which are too small to definitively characterize (statistically likely to represent cysts).  In addition, there is an 11 mm cyst in the lateral aspect of the interpolar region of the right kidney. Incidental note is made of a retroaortic left renal vein (normal anatomical variant).   No ascites or pneumoperitoneum and no pathologic distension of small bowel.  No definite pathologic lymphadenopathy identified within the abdomen or pelvis.  Prostate and urinary bladder are unremarkable in appearance.   Musculoskeletal: Two small sclerotic lesions are noted in the left side of the pelvis, favored to represent small bone islands. There are no aggressive appearing lytic or blastic lesions noted in the visualized portions of the skeleton.    Review of the MIP images confirms the above findings.   IMPRESSION: 1.  No evidence of significant aortoiliac occlusive disease.  There is only mild atherosclerosis throughout the abdominal and pelvic vasculature, with measurements as above. 2.  No acute findings in the abdomen or pelvis.   3.  Multiple small low attenuation lesions in the kidneys bilaterally.  The majority of these are too small to definitively characterize but are statistically likely to represent small cysts.     Original Report Authenticated By: Trudie Reed, M.D.   Impression:  Severe symptomatic aortic stenosis with normal left ventricular systolic function in this 61 year old gentleman with known bicuspid aortic valve who is otherwise fairly healthy. I agree that elective aortic valve replacement is appropriate at this time.  I personally reviewed the patient's recent transthoracic echocardiogram and heart catheterization and concur with findings as noted. The patient does not  have significant coronary artery disease. CT angiogram of the heart chest and abdomen demonstrate anatomical findings that appear relatively stable for minimally invasive approach for surgery.    Plan:  The patient and his wife were counseled at length regarding surgical alternatives with respect to valve replacement.  The indications for surgical intervention at this time were discussed at length compared with long-term prognosis with continued medical therapy and close followup.  In addition, discussion was held comparing in contrast and the risks of mechanical valve replacement and the need for lifelong anticoagulation versus use of a bioprosthetic tissue valve and the associated potential for late structural valve deterioration in failure. Other alternatives including the Ross autograft procedure, homograft aortic root replacement, stentless bioprosthetic tissue valve replacement, valve repair, and transcatheter aortic valve replacement were discussed.  This discussion was placed in the context of the patient's age, lifestyle and other particular circumstances.  Alternative surgical approaches have been discussed, including a comparison between conventional sternotomy and minimally-invasive techniques.  The relative risks and benefits of each have been reviewed as they pertain to the patient's specific circumstances, and all of their questions have been addressed.  Expectations for the patient's postoperative surgical recovery and return to work have been reviewed.  We plan to proceed with aortic valve replacement via right mini thoracotomy approach on Thursday, December 19. The patient specifically requests that his valve be replaced using a bioprosthetic tissue valve. He understands and accepts all potential associated risks of surgery including but not limited to risk of death, stroke, myocardial infarction, congestive heart failure, respiratory failure, renal failure, bleeding requiring blood  transfusion, arrhythmia, heart block or bradycardia requiring permanent pacemaker, possible need for conversion to transverse or full median sternotomy, or other late complications related to valve replacement or the minimally invasive technique. All of his questions been addressed.   Salvatore Decent. Cornelius Moras, MD 05/23/2012 5:57 PM

## 2012-06-02 NOTE — Anesthesia Preprocedure Evaluation (Signed)
Anesthesia Evaluation  Patient identified by MRN, date of birth, ID band Patient unresponsive    Reviewed: Allergy & Precautions, H&P , NPO status , Patient's Chart, lab work & pertinent test results  Airway       Dental   Pulmonary  breath sounds clear to auscultation        Cardiovascular Rhythm:Regular Rate:Normal     Neuro/Psych    GI/Hepatic   Endo/Other    Renal/GU      Musculoskeletal   Abdominal   Peds  Hematology   Anesthesia Other Findings   Reproductive/Obstetrics                           Anesthesia Physical Anesthesia Plan  ASA: IV and emergent  Anesthesia Plan: General   Post-op Pain Management:    Induction: Intravenous  Airway Management Planned: Double Lumen EBT  Additional Equipment:   Intra-op Plan:   Post-operative Plan: Post-operative intubation/ventilation  Informed Consent: I have reviewed the patients History and Physical, chart, labs and discussed the procedure including the risks, benefits and alternatives for the proposed anesthesia with the patient or authorized representative who has indicated his/her understanding and acceptance.     Plan Discussed with: CRNA and Surgeon  Anesthesia Plan Comments:         Anesthesia Quick Evaluation

## 2012-06-02 NOTE — Brief Op Note (Addendum)
                   [address]            [name] [address]          [phone]    06/02/2012  1:21 PM  PATIENT:  John Boyd  61 y.o. male  PRE-OPERATIVE DIAGNOSIS:  AS  POST-OPERATIVE DIAGNOSIS:  AS  PROCEDURE:  Procedure(s): MINIMALLY INVASIVE AORTIC VALVE REPLACEMENT (AVR)#23 MAGNAEASE BIOPROSTHETIC INTRAOPERATIVE TRANSESOPHAGEAL ECHOCARDIOGRAM  SURGEON:    Purcell Nails, MD  ASSISTANTS:  Gershon Crane, PA-C  ANESTHESIA:   Kipp Brood, MD  CROSSCLAMP TIME:   102'  CARDIOPULMONARY BYPASS TIME: 141'  FINDINGS:  Bicuspid native aortic valve with severe aortic stenosis  Normal LV size and systolic function  COMPLICATIONS: None  PRE-OPERATIVE WEIGHT: 74kg  PATIENT DISPOSITION:   TO SICU IN STABLE CONDITION  Seraiah Nowack H 06/02/2012 2:00 PM

## 2012-06-02 NOTE — Significant Event (Signed)
1745-call to OR regarding low Hgb of 7.2.

## 2012-06-02 NOTE — Op Note (Signed)
CARDIOTHORACIC SURGERY OPERATIVE NOTE  Date of Procedure:   06/02/2012  Preoperative Diagnosis:  Bleeding s/p minimally invasive aortic valve replacement  Postoperative Diagnosis:  same  Procedure:    Reexploration for bleeding  Surgeon:    Salvatore Decent. Cornelius Moras, MD  Assistant:    Marlou Porch, CRNFA  Anesthesia:    Kipp Brood, MD  Operative Findings:   Bleeding from intercostal vessels 3rd intercostal space      DETAILS OF THE OPERATIVE PROCEDURE  The patient is brought to the operating room on the above mentioned date and placed in the supine position on the operating table.  General endotracheal anesthesia is monitored under the care and direction of Dr. Noreene Larsson.  The patient is reintubated using a dual lumen endotracheal tube.  A time out procedure is performed. Intravenous antibiotics are administered. The patient's anterior chest abdomen and both groins are prepared and draped in a sterile manner. The previous miniature right anterior thoracotomy incision is reopened. The costal cartilage plates are removed. The right pleural space is explored. There is a large amount of blood in the right pleural space which is evacuated. The right pleural space is irrigated with copious saline solution. Some bleeding is identified from the intercostal vessels along the third intercostal space laterally. These are controlled with several suture ligatures. Further exploration is performed. The transected ends of the right internal mammary artery are oversewn although there is no sign of ongoing bleeding from either end of these vessels. The pericardial closure is reopened and the mediastinum inspected. There is no blood at all in the mediastinum. The aortotomy is inspected and perfectly dry. The pericardium is reapproximated. Continued exploration is performed throughout the pleural space. The search is continued for an extended period of time until it appears clear there is no significant ongoing residual  bleeding. An additional 58 French chest tube is placed through a new stab incision inferiorly to drain the right pleural space. The miniature thoracotomy incision was reclosed and the plates replaced to reattach the second and third costal cartilages to the sternum. The thoracotomy incision is closed in multiple layers and the skin incision closed was subcuticular skin closure. The patient tolerated the procedure well and is reintubated with a single-lumen endotracheal tube. The patient is transported back to the surgical intensive care unit in stable condition. There are no intraoperative complications.     Salvatore Decent. Cornelius Moras MD 06/02/2012 8:13 PM

## 2012-06-02 NOTE — Anesthesia Postprocedure Evaluation (Signed)
  Anesthesia Post-op Note  Patient: John Boyd  Procedure(s) Performed: Procedure(s) (LRB) with comments: EXPLORATION POST OPERATIVE OPEN HEART (N/A)  Patient Location: SICU  Anesthesia Type:General  Level of Consciousness: sedated and Patient remains intubated per anesthesia plan  Airway and Oxygen Therapy: Patient remains intubated per anesthesia plan and Patient placed on Ventilator (see vital sign flow sheet for setting)  Post-op Pain: none  Post-op Assessment: Post-op Vital signs reviewed, Patient's Cardiovascular Status Stable and Respiratory Function Stable  Post-op Vital Signs: stable  Complications: No apparent anesthesia complications

## 2012-06-02 NOTE — CV Procedure (Signed)
Intraoperative Transesophageal Echocardiography Note:  Mr. John Boyd is a 61 year old male with a history of a bicuspid aortic valve and aortic stenosis who has developed worsening symptoms of exertional shortness of breath and fatigue and is now scheduled to undergo aortic valve replacement using the minimally invasive technique. Intraoperative transesophageal echocardiography was requested to evaluate the aortic valve to assist with repair and to assess for any other valvular pathology, and to serve as a monitor for intraoperative volume status.  The patient was brought to the operating room at [facility] and general anesthesia was induced without difficulty. Following endotracheal intubation and orogastric suctioning, the transesophageal echocardiography probe was inserted into the esophagus without difficulty.  Impression: Pre-bypass findings:  1. Aortic valve: The aortic valve was bicuspid. The leaflets were severely thickened and there was obvious restriction to opening. Continuous-wave Doppler interrogation of the left ventricular outflow revealed a peak velocity of 3.33 m/s and maximum gradient of 44 mm of mercury and a mean gradient of 27 mm of mercury. Aortic valve area was 1.01 cm using the VTI method and 0.92 cm using the V-max method there was no aortic insufficiency. The aortic annulus measured 2.12 cm.  2. Ascending aorta:  The  ascending aorta was not enlarged. The aortic root at the sinuses of Valsalva measured 3.01 cm. The sinotubular ridge was 2.76 cm, and proximal ascending aorta was 3.06 cm. There was no significant atheromatous disease of the aorta appreciated. There was no effacement of the sinuses of Valsalva.  3. Mitral valve: The mitral leaflets appeared to coapt normally. There were no flail or prolapsing segments noted and there was trace mitral insufficiency.  4. Left ventricle: There was normal appearing left ventricular function. There was vigorous  contractility in all segments interrogated with no regional wall motion abnormalities. Left ventricular ejection fraction was estimated at 65%. Left ventricular end-diastolic diameter measured 4.03 cm at the chordal level in the transgastric long axis view. Left ventricular wall thickness measured 0.9 cm at the anterior wall and 0.95 cm at the posterior wall at end diastole at the mid-papillary level.  5. Right ventricle: There was normal appearing right ventricular function. The right ventricular size was normal and there was good contractility the right ventricular free wall.  6. Interatrial septum: The interatrial septum was intact without evidence of patent foramen ovale or atrial septal defect by color Doppler or bubble study.  7 Left atrium: The left atrium did not appear to be enlarged. There was no thrombus noted in the left atrium or left atrial appendage.  8. Descending aorta: The descending aorta measured 2.22 cm and there was no atheromatous disease in the wall of aorta. Post-bypass findings:  1. Aortic valve: There was a bioprosthetic valve in aortic position. The leaflets opened normally and there was no aortic insufficiency.  2. Mitral valve: The mitral valve was unchanged from the pre-bypass study. The leaflets opened well and there was trace mitral insufficiency  3. Left ventricle: There was good contractility in all segments interrogated and ejection fraction was estimated 60-65%.  4. Right ventricle: The right ventricular function appeared unchanged from the pre-bypass study and appeared normal.  Kipp Brood, M.D.

## 2012-06-02 NOTE — Anesthesia Preprocedure Evaluation (Addendum)
Anesthesia Evaluation  Patient identified by MRN, date of birth, ID band Patient awake    Reviewed: Allergy & Precautions, H&P , NPO status , Patient's Chart, lab work & pertinent test results  History of Anesthesia Complications Negative for: history of anesthetic complications  Airway Mallampati: II TM Distance: >3 FB Neck ROM: Full    Dental  (+) Teeth Intact, Dental Advisory Given and Chipped   Pulmonary neg pulmonary ROS,  breath sounds clear to auscultation        Cardiovascular hypertension, Pt. on medications Rhythm:Regular Rate:Normal     Neuro/Psych    GI/Hepatic negative GI ROS, Neg liver ROS,   Endo/Other  negative endocrine ROS  Renal/GU negative Renal ROS     Musculoskeletal   Abdominal   Peds  Hematology   Anesthesia Other Findings   Reproductive/Obstetrics                         Anesthesia Physical Anesthesia Plan  ASA: III  Anesthesia Plan: General   Post-op Pain Management:    Induction: Intravenous  Airway Management Planned: Oral ETT and Double Lumen EBT  Additional Equipment: TEE, PA Cath, CVP, Arterial line and Ultrasound Guidance Line Placement  Intra-op Plan:   Post-operative Plan:   Informed Consent: I have reviewed the patients History and Physical, chart, labs and discussed the procedure including the risks, benefits and alternatives for the proposed anesthesia with the patient or authorized representative who has indicated his/her understanding and acceptance.   Dental advisory given  Plan Discussed with: CRNA and Surgeon  Anesthesia Plan Comments:        Anesthesia Quick Evaluation

## 2012-06-02 NOTE — Interval H&P Note (Signed)
History and Physical Interval Note:  06/02/2012 7:36 AM  John Boyd  has presented today for surgery, with the diagnosis of AS  The various methods of treatment have been discussed with the patient and family. After consideration of risks, benefits and other options for treatment, the patient has consented to  Procedure(s) (LRB) with comments: MINIMALLY INVASIVE AORTIC VALVE REPLACEMENT (AVR) (N/A) INTRAOPERATIVE TRANSESOPHAGEAL ECHOCARDIOGRAM (N/A) as a surgical intervention .  The patient's history has been reviewed, patient examined, no change in status, stable for surgery.  I have reviewed the patient's chart and labs.  Questions were answered to the patient's satisfaction.     OWEN,CLARENCE H

## 2012-06-02 NOTE — Transfer of Care (Signed)
Immediate Anesthesia Transfer of Care Note  Patient: John Boyd  Procedure(s) Performed: Procedure(s) (LRB) with comments: MINIMALLY INVASIVE AORTIC VALVE REPLACEMENT (AVR) (N/A) INTRAOPERATIVE TRANSESOPHAGEAL ECHOCARDIOGRAM (N/A)  Patient Location: PACU  Anesthesia Type:General  Level of Consciousness: unresponsive  Airway & Oxygen Therapy: 100 O 2 Ambu to Vent 600 x 12 5 and 50 % per RT  Post-op Assessment: Report given to PACU RN  Post vital signs: Reviewed and stable  Complications: No apparent anesthesia complications

## 2012-06-03 ENCOUNTER — Other Ambulatory Visit: Payer: Self-pay

## 2012-06-03 ENCOUNTER — Inpatient Hospital Stay (HOSPITAL_COMMUNITY): Payer: BC Managed Care – PPO

## 2012-06-03 DIAGNOSIS — I359 Nonrheumatic aortic valve disorder, unspecified: Principal | ICD-10-CM

## 2012-06-03 LAB — BASIC METABOLIC PANEL
BUN: 15 mg/dL (ref 6–23)
CO2: 23 mEq/L (ref 19–32)
Calcium: 8.1 mg/dL — ABNORMAL LOW (ref 8.4–10.5)
Glucose, Bld: 132 mg/dL — ABNORMAL HIGH (ref 70–99)
Sodium: 140 mEq/L (ref 135–145)

## 2012-06-03 LAB — GLUCOSE, CAPILLARY
Glucose-Capillary: 108 mg/dL — ABNORMAL HIGH (ref 70–99)
Glucose-Capillary: 111 mg/dL — ABNORMAL HIGH (ref 70–99)
Glucose-Capillary: 114 mg/dL — ABNORMAL HIGH (ref 70–99)
Glucose-Capillary: 119 mg/dL — ABNORMAL HIGH (ref 70–99)
Glucose-Capillary: 123 mg/dL — ABNORMAL HIGH (ref 70–99)
Glucose-Capillary: 130 mg/dL — ABNORMAL HIGH (ref 70–99)
Glucose-Capillary: 138 mg/dL — ABNORMAL HIGH (ref 70–99)
Glucose-Capillary: 177 mg/dL — ABNORMAL HIGH (ref 70–99)

## 2012-06-03 LAB — POCT I-STAT 3, ART BLOOD GAS (G3+)
Acid-base deficit: 2 mmol/L (ref 0.0–2.0)
Patient temperature: 37
Patient temperature: 37
pH, Arterial: 7.374 (ref 7.350–7.450)

## 2012-06-03 LAB — PREPARE PLATELET PHERESIS: Unit division: 0

## 2012-06-03 LAB — PREPARE FRESH FROZEN PLASMA: Unit division: 0

## 2012-06-03 LAB — CBC
HCT: 34.4 % — ABNORMAL LOW (ref 39.0–52.0)
Hemoglobin: 12.2 g/dL — ABNORMAL LOW (ref 13.0–17.0)
MCH: 30 pg (ref 26.0–34.0)
MCV: 84.7 fL (ref 78.0–100.0)
RBC: 4.06 MIL/uL — ABNORMAL LOW (ref 4.22–5.81)

## 2012-06-03 MED ORDER — SODIUM CHLORIDE 0.9 % IJ SOLN
3.0000 mL | Freq: Two times a day (BID) | INTRAMUSCULAR | Status: DC
  Administered 2012-06-03 – 2012-06-06 (×7): 3 mL via INTRAVENOUS

## 2012-06-03 MED ORDER — FUROSEMIDE 10 MG/ML IJ SOLN
20.0000 mg | Freq: Four times a day (QID) | INTRAMUSCULAR | Status: AC
  Administered 2012-06-03 (×2): 20 mg via INTRAVENOUS

## 2012-06-03 MED ORDER — INSULIN ASPART 100 UNIT/ML ~~LOC~~ SOLN: 0.0000 [IU] | SUBCUTANEOUS | Status: DC

## 2012-06-03 MED ORDER — OXYCODONE HCL 5 MG PO TABS: 5.0000 mg | ORAL_TABLET | ORAL | Status: DC | PRN

## 2012-06-03 MED ORDER — LISINOPRIL 10 MG PO TABS: 10.0000 mg | ORAL_TABLET | Freq: Every day | ORAL | Status: DC

## 2012-06-03 MED ORDER — ALPRAZOLAM 0.25 MG PO TABS
0.2500 mg | ORAL_TABLET | Freq: Four times a day (QID) | ORAL | Status: DC | PRN
  Administered 2012-06-05: 0.25 mg via ORAL
  Filled 2012-06-03: qty 1

## 2012-06-03 MED ORDER — TRAMADOL HCL 50 MG PO TABS
50.0000 mg | ORAL_TABLET | ORAL | Status: DC | PRN
  Administered 2012-06-03: 50 mg via ORAL
  Filled 2012-06-03: qty 1

## 2012-06-03 MED ORDER — SODIUM CHLORIDE 0.9 % IJ SOLN: 3.0000 mL | INTRAMUSCULAR | Status: DC | PRN

## 2012-06-03 MED ORDER — POTASSIUM CHLORIDE CRYS ER 20 MEQ PO TBCR
20.0000 meq | EXTENDED_RELEASE_TABLET | Freq: Two times a day (BID) | ORAL | Status: DC
  Filled 2012-06-03 (×2): qty 1

## 2012-06-03 MED ORDER — FUROSEMIDE 40 MG PO TABS
40.0000 mg | ORAL_TABLET | Freq: Two times a day (BID) | ORAL | Status: DC
  Administered 2012-06-04: 40 mg via ORAL
  Filled 2012-06-03 (×3): qty 1

## 2012-06-03 MED ORDER — MORPHINE SULFATE 2 MG/ML IJ SOLN: 2.0000 mg | INTRAMUSCULAR | Status: DC | PRN

## 2012-06-03 MED ORDER — TRAMADOL HCL 50 MG PO TABS
50.0000 mg | ORAL_TABLET | Freq: Four times a day (QID) | ORAL | Status: DC | PRN
  Administered 2012-06-05 – 2012-06-07 (×3): 50 mg via ORAL
  Filled 2012-06-03 (×3): qty 1

## 2012-06-03 MED ORDER — INSULIN ASPART 100 UNIT/ML ~~LOC~~ SOLN
0.0000 [IU] | SUBCUTANEOUS | Status: AC
  Administered 2012-06-03: 4 [IU] via SUBCUTANEOUS
  Administered 2012-06-03: 2 [IU] via SUBCUTANEOUS

## 2012-06-03 MED ORDER — TEMAZEPAM 15 MG PO CAPS
15.0000 mg | ORAL_CAPSULE | Freq: Every evening | ORAL | Status: DC | PRN
  Administered 2012-06-03: 15 mg via ORAL
  Filled 2012-06-03: qty 1

## 2012-06-03 MED ORDER — SODIUM CHLORIDE 0.9 % IV SOLN: 250.0000 mL | INTRAVENOUS | Status: DC | PRN

## 2012-06-03 MED ORDER — MOVING RIGHT ALONG BOOK
Freq: Once | Status: AC
  Administered 2012-06-03: 14:00:00
  Filled 2012-06-03: qty 1

## 2012-06-03 MED ORDER — FUROSEMIDE 10 MG/ML IJ SOLN: 20.0000 mg | Freq: Once | INTRAMUSCULAR | Status: DC

## 2012-06-03 NOTE — Addendum Note (Signed)
Addendum  created 06/03/12 1523 by Kipp Brood, MD   Modules edited:Notes Section

## 2012-06-03 NOTE — Progress Notes (Signed)
[facility] BRIEF SICU PROGRESS NOTE  1 Day Post-Op S/P Procedure(s) (LRB): MINIMALLY INVASIVE AORTIC VALVE REPLACEMENT (AVR) (N/A)  INTRAOPERATIVE TRANSESOPHAGEAL ECHOCARDIOGRAM (N/A)   1 Day Post-Op  S/P Procedure(s) (LRB): EXPLORATION POST OPERATIVE OPEN HEART (N/A)   John Boyd is progressing quite nicely  Plan: Continue routine care.  Leave chest tubes 1-2 more days depending upon output. Transfer step down.  [name] H 06/03/2012 1:15 PM

## 2012-06-03 NOTE — Progress Notes (Signed)
1 Day Post-Op Procedure(s) (LRB): EXPLORATION POST OPERATIVE OPEN HEART (N/A) Subjective: S/P AVR -  minithorcotomy takeback for bleeding from chest wall neurointact Hemodynamics stable Min complaint of pain Objective: Vital signs in last 24 hours: Temp:  [95.9 F (35.5 C)-99.7 F (37.6 C)] 99.7 F (37.6 C) (12/20 0700) Pulse Rate:  [63-88] 80  (12/20 0700) Cardiac Rhythm:  [-] Atrial paced (12/20 0330) Resp:  [0-22] 8  (12/20 0700) BP: (93-123)/(57-86) 96/57 mmHg (12/20 0700) SpO2:  [97 %-100 %] 100 % (12/20 0700) Arterial Line BP: (94-127)/(52-87) 108/58 mmHg (12/20 0700) FiO2 (%):  [40 %-50.4 %] 40 % (12/20 0245) Weight:  [180 lb 3.2 oz (81.738 kg)] 180 lb 3.2 oz (81.738 kg) (12/20 0445)  Hemodynamic parameters for last 24 hours: PAP: (21-32)/(7-14) 29/12 mmHg CO:  [3.1 L/min-5.3 L/min] 4.8 L/min CI:  [1.6 L/min/m2-2.8 L/min/m2] 2.5 L/min/m2  Intake/Output from previous day: 12/19 0701 - 12/20 0700 In: 11323.7 [P.O.:120; I.V.:6675.7; Blood:3178; IV Piggyback:1350] Out: 7830 [Urine:5080; Blood:1250; Chest Tube:1340] Intake/Output this shift:    A-pacing 80/min  Lab Results:  Basename 06/03/12 0425 06/02/12 2120  WBC 12.8* 12.0*  HGB 12.2* 12.6*  HCT 34.4* 35.2*  PLT 99* 97*   BMET:  Basename 06/03/12 0425 06/02/12 2112 06/02/12 1821  NA 140 141 --  K 4.0 4.1 --  CL 108 106 --  CO2 23 -- 22  GLUCOSE 132* 136* --  BUN 15 12 --  CREATININE 0.90 0.80 --  CALCIUM 8.1* -- 7.4*    PT/INR:  Basename 06/02/12 2120  LABPROT 15.4*  INR 1.24   ABG    Component Value Date/Time   PHART 7.399 06/03/2012 0416   HCO3 22.9 06/03/2012 0416   TCO2 24 06/03/2012 0416   ACIDBASEDEF 2.0 06/03/2012 0416   O2SAT 97.0 06/03/2012 0416   CBG (last 3)   Basename 06/03/12 0418 06/03/12 0119 06/02/12 2342  GLUCAP 177* 96 119*    Assessment/Plan: S/P Procedure(s) (LRB): EXPLORATION POST OPERATIVE OPEN HEART (N/A) See progression orders   LOS: 1 day    VAN TRIGT  III,John Boyd 06/03/2012

## 2012-06-03 NOTE — Care Management Note (Signed)
    Page 1 of 1   06/03/2012     9:01:38 AM   CARE MANAGEMENT NOTE 06/03/2012  Patient:  John Boyd, John Boyd   Account Number:  [id number]  Date Initiated:  06/03/2012  Documentation initiated by:  Junius Creamer  Subjective/Objective Assessment:   adm w valve rep     Action/Plan:   lives w wife, pcp dr hochrein   Anticipated DC Date:     Anticipated DC Plan:  HOME W HOME HEALTH SERVICES      DC Planning Services  CM consult      Choice offered to / List presented to:             Status of service:   Medicare Important Message given?   (If response is "NO", the following Medicare IM given date fields will be blank) Date Medicare IM given:   Date Additional Medicare IM given:    Discharge Disposition:    Per UR Regulation:  Reviewed for med. necessity/level of care/duration of stay  If discussed at Long Length of Stay Meetings, dates discussed:    Comments:  wife wanda [phone]

## 2012-06-03 NOTE — Procedures (Signed)
Extubation Procedure Note  Patient Details:   Name: John Boyd DOB: 1950/08/01 MRN: [id number]   Airway Documentation:  Airway 8 mm (Active)  Secured at (cm) 22 cm 06/03/2012 12:08 AM  Measured From Lips 06/03/2012 12:08 AM  Secured Location Right 06/03/2012 12:08 AM  Secured By Caron Presume Tape 06/03/2012 12:08 AM  Cuff Pressure (cm H2O) 28 cm H2O 06/03/2012 12:08 AM    Evaluation  O2 sats: stable throughout Complications: No apparent complications Patient did tolerate procedure well. Bilateral Breath Sounds: Clear;Diminished   Yes  ABG    Component Value Date/Time   PHART 7.374 06/03/2012 0227   PCO2ART 37.3 06/03/2012 0227   PO2ART 103.0* 06/03/2012 0227   HCO3 21.8 06/03/2012 0227   TCO2 23 06/03/2012 0227   ACIDBASEDEF 3.0* 06/03/2012 0227   O2SAT 98.0 06/03/2012 0227     Prior to extubation NIF -25, VC , cuff leak also present. Pt then extubated and placed on 3L [place]. RT and RN will continue to monitor.    Driscilla Grammes 06/03/2012, 3:01 AM

## 2012-06-03 NOTE — Progress Notes (Signed)
1 Day Post-Op Procedure(s) (LRB): EXPLORATION POST OPERATIVE OPEN HEART (N/A) Subjective: Progressing after AVR  Objective: Vital signs in last 24 hours: Temp:  [96.4 F (35.8 C)-99.7 F (37.6 C)] 97.9 F (36.6 C) (12/20 1637) Pulse Rate:  [63-81] 81  (12/20 1800) Cardiac Rhythm:  [-] Atrial paced (12/20 1100) Resp:  [0-22] 0  (12/20 1800) BP: (93-123)/(55-86) 103/55 mmHg (12/20 1800) SpO2:  [94 %-100 %] 94 % (12/20 1800) Arterial Line BP: (94-127)/(52-77) 106/54 mmHg (12/20 0900) FiO2 (%):  [40 %-50.4 %] 40 % (12/20 0245) Weight:  [180 lb 3.2 oz (81.738 kg)] 180 lb 3.2 oz (81.738 kg) (12/20 0445)  Hemodynamic parameters for last 24 hours: PAP: (25-32)/(10-14) 28/12 mmHg CO:  [3.5 L/min-4.8 L/min] 4.8 L/min CI:  [1.8 L/min/m2-2.5 L/min/m2] 2.5 L/min/m2  Intake/Output from previous day: 12/19 0701 - 12/20 0700 In: 11323.7 [P.O.:120; I.V.:6675.7; Blood:3178; IV Piggyback:1350] Out: 7830 [Urine:5080; Blood:1250; Chest Tube:1340] Intake/Output this shift:      Lab Results:  Basename 06/03/12 0425 06/02/12 2120  WBC 12.8* 12.0*  HGB 12.2* 12.6*  HCT 34.4* 35.2*  PLT 99* 97*   BMET:  Basename 06/03/12 0425 06/02/12 2112 06/02/12 1821  NA 140 141 --  K 4.0 4.1 --  CL 108 106 --  CO2 23 -- 22  GLUCOSE 132* 136* --  BUN 15 12 --  CREATININE 0.90 0.80 --  CALCIUM 8.1* -- 7.4*    PT/INR:  Basename 06/02/12 2120  LABPROT 15.4*  INR 1.24   ABG    Component Value Date/Time   PHART 7.399 06/03/2012 0416   HCO3 22.9 06/03/2012 0416   TCO2 24 06/03/2012 0416   ACIDBASEDEF 2.0 06/03/2012 0416   O2SAT 97.0 06/03/2012 0416   CBG (last 3)   Basename 06/03/12 1635 06/03/12 1113 06/03/12 0932  GLUCAP 138* 114* 108*    Assessment/Plan: S/P Procedure(s) (LRB): EXPLORATION POST OPERATIVE OPEN HEART (N/A) Patient examined and record reviewed.Hemodynamics stable,labs satisfactory.Patient had stable day.Continue current care. VAN TRIGT III,Rosenda Geffrard 06/03/2012      LOS: 1 day    VAN TRIGT III,Timera Windt 06/03/2012

## 2012-06-03 NOTE — Progress Notes (Signed)
Anesthesiology Follow-up:  61 Y/O male underwent minimally invasive AVR and then re-exploration for chest wall bleeding yesterday.  Awake, alert, C/O some incisional pain, hemodynamically stable.  VS: T- 37.6 BP 98/58 RR 15 HR 80 (atrially paced) O2 Sat 99% on 4L  PAP 28/11 CO/CI 4.8/2.5  H/H 12.2/34.4 Plts: 99,000 K-4.0 BUN/Cr. 15/0.90  Extubated 3 hours post-op following re-exploration for bleeding.  Doing very well overall, to floor today.  Kipp Brood, MD

## 2012-06-04 LAB — GLUCOSE, CAPILLARY
Glucose-Capillary: 122 mg/dL — ABNORMAL HIGH (ref 70–99)
Glucose-Capillary: 136 mg/dL — ABNORMAL HIGH (ref 70–99)

## 2012-06-04 LAB — BASIC METABOLIC PANEL
Calcium: 8.6 mg/dL (ref 8.4–10.5)
GFR calc Af Amer: >90 mL/min (ref >90)
GFR calc non Af Amer: 88 mL/min — ABNORMAL LOW (ref >90)
Potassium: 3.7 mEq/L (ref 3.5–5.1)
Sodium: 137 mEq/L (ref 135–145)

## 2012-06-04 LAB — CBC
Hemoglobin: 10.6 g/dL — ABNORMAL LOW (ref 13.0–17.0)
MCH: 30 pg (ref 26.0–34.0)
MCHC: 34.3 g/dL (ref 30.0–36.0)
Platelets: 90 10*3/uL — ABNORMAL LOW (ref 150–400)

## 2012-06-04 MED ORDER — FUROSEMIDE 40 MG PO TABS
40.0000 mg | ORAL_TABLET | Freq: Every day | ORAL | Status: DC
  Administered 2012-06-05 – 2012-06-07 (×3): 40 mg via ORAL
  Filled 2012-06-04 (×4): qty 1

## 2012-06-04 MED ORDER — POTASSIUM CHLORIDE CRYS ER 20 MEQ PO TBCR
20.0000 meq | EXTENDED_RELEASE_TABLET | Freq: Every day | ORAL | Status: DC
  Administered 2012-06-04 – 2012-06-07 (×4): 20 meq via ORAL
  Filled 2012-06-04 (×3): qty 1

## 2012-06-04 NOTE — Progress Notes (Signed)
91CARDIAC REHAB PHASE I   PRE:  Rate/Rhythm: 91 SR  BP:  Supine: 111/69  Sitting:   Standing:    SaO2: 85 RA  MODE:  Ambulation: 300 ft   POST:  Rate/Rhythem: 99 SR  BP:  Supine:   Sitting: 106/59  Standing:    SaO2: 97 3L 1450-1545 On arrival pt in bed very reluctant to try to walk. O2 sat on 1L 85%. Pt has been in bed all day and not using IS. After encouragement able to convince pt to try to walk. Assisted x 2 used walker and O2 3L to ambulate. Gait steady with walker, slow pace. Pt able to walk 300 feet with encouragement. Pt to recliner after walk with call light in reach and wife present. Strongly encouraged more walks, using IS and staying out of bed. Kizzie Bane, Nila Nephew

## 2012-06-04 NOTE — Progress Notes (Signed)
Pt ambulated 450 ft. On 3L O2 [place] with RW, RN, and wife.  Tolerated fair, steady gait and pace, slightly SOB.  Back to bed with CT to 20 cm suction, call bell in reach.  Will continue to monitor and encourage. Ave Filter

## 2012-06-04 NOTE — Progress Notes (Signed)
P thas refused ambulation so far today, "the pain from this morning as worn me out, I can't breathe good".  Sats 96% on RA, lungs clear but diminished, pain medication offered-pt declines.   Ave Filter

## 2012-06-04 NOTE — Progress Notes (Addendum)
                   [address]            [place] [address]          [phone]      2 Days Post-Op Procedure(s) (LRB): EXPLORATION POST OPERATIVE OPEN HEART (N/A)  Subjective: Patient's appetite is improving.  Objective: Vital signs in last 24 hours: Temp:  [97.9 F (36.6 C)-99.7 F (37.6 C)] 98.4 F (36.9 C) (12/21 0457) Pulse Rate:  [77-82] 82  (12/21 0457) Cardiac Rhythm:  [-] Normal sinus rhythm (12/20 1925) Resp:  [0-20] 16  (12/21 0457) BP: (94-109)/(55-65) 99/59 mmHg (12/21 0457) SpO2:  [92 %-100 %] 92 % (12/21 0635) Arterial Line BP: (106)/(54-55) 106/54 mmHg (12/20 0900) Weight:  [79.7 kg (175 lb 11.3 oz)] 79.7 kg (175 lb 11.3 oz) (12/20 1922)  Pre op weight 74 kg Current Weight  06/03/12 79.7 kg (175 lb 11.3 oz)    Hemodynamic parameters for last 24 hours: PAP: (28)/(11-12) 28/12 mmHg  Intake/Output from previous day: 12/20 0701 - 12/21 0700 In: 857 [P.O.:700; I.V.:155; IV Piggyback:2] Out: 2000 [Urine:1800; Chest Tube:200]   Physical Exam:  Cardiovascular: RRR, no murmurs, gallops, or rubs. Pulmonary: Diminished at right base; no rales, wheezes, or rhonchi. Abdomen: Soft, non tender, bowel sounds present. Extremities: Mild bilateral lower extremity edema. Wounds: Clean and dry.  No erythema or signs of infection.  Lab Results: CBC: Basename 06/04/12 0505 06/03/12 0425  WBC 13.6* 12.8*  HGB 10.6* 12.2*  HCT 30.9* 34.4*  PLT 90* 99*   BMET:  Basename 06/04/12 0505 06/03/12 0425  NA 137 140  K 3.7 4.0  CL 101 108  CO2 27 23  GLUCOSE 116* 132*  BUN 16 15  CREATININE 0.96 0.90  CALCIUM 8.6 8.1*    PT/INR:  Lab Results  Component Value Date   INR 1.24 06/02/2012   INR 1.41 06/02/2012   INR 1.39 06/02/2012   ABG:  INR: Will add last result for INR, ABG once components are confirmed Will add last 4 CBG results once components are confirmed  Assessment/Plan:  1. CV - SR. On Lopressor 12.5 bid with parameters. 2.   Pulmonary - Chest tube is to suction.There is no air leak.Chest tubes with 220 cc of output last 24 hours. Will not remove today. Encourage incentive spirometer and flutter valve. 3. Volume Overload - On Lasix 40 bid but with labile blood pressure, will only give daily 4.  Acute blood loss anemia - H and H down to 10.6 and 30.9 this am 5.Supplement potassium 6.Thrombocytopenia-platelets 90,000  ZIMMERMAN,DONIELLE MPA-C 06/04/2012,7:58 AM   patient examined and medical record reviewed,agree with above note. VAN TRIGT III,Iliyah Bui 06/04/2012

## 2012-06-05 LAB — CBC
MCV: 89.8 fL (ref 78.0–100.0)
Platelets: 98 10*3/uL — ABNORMAL LOW (ref 150–400)
RBC: 3.54 MIL/uL — ABNORMAL LOW (ref 4.22–5.81)
RDW: 14.2 % (ref 11.5–15.5)
WBC: 13 10*3/uL — ABNORMAL HIGH (ref 4.0–10.5)

## 2012-06-05 MED ORDER — SODIUM CHLORIDE 0.9 % IV SOLN
INTRAVENOUS | Status: AC
  Administered 2012-06-05: 22:00:00 via INTRAVENOUS

## 2012-06-05 MED ORDER — LACTULOSE 10 GM/15ML PO SOLN
20.0000 g | Freq: Once | ORAL | Status: AC
  Administered 2012-06-05: 20 g via ORAL
  Filled 2012-06-05: qty 30

## 2012-06-05 NOTE — Progress Notes (Signed)
Pt ambulated approx 1800 ft with RW on RA, sats sustained 95% RA.  Back to recliner with call bell in reach, will remain on RA for now.  Will continue to monitor and encourage. Ave Filter

## 2012-06-05 NOTE — Progress Notes (Signed)
Pt ambulated 650 ft with wife and RW on RA.  Tolerated well, steady gate and pace.  Will continue to monitor and encourage. Ave Filter

## 2012-06-05 NOTE — Progress Notes (Signed)
Pt ambulated approx 1200 ft on 3L [place] with RW, RN, and wife.  Tolerated very well, steady gait and pace, no SOB, back to chair.  Will continue to monitor and encourage. Ave Filter

## 2012-06-05 NOTE — Progress Notes (Addendum)
                   [address]            [place] [address]          [phone]      3 Days Post-Op Procedure(s) (LRB): EXPLORATION POST OPERATIVE OPEN HEART (N/A)  Subjective: Patient's feeling better today.  Objective: Vital signs in last 24 hours: Temp:  [97.9 F (36.6 C)-98.9 F (37.2 C)] 97.9 F (36.6 C) (12/22 0449) Pulse Rate:  [76-94] 76  (12/22 0449) Cardiac Rhythm:  [-] Normal sinus rhythm (12/21 2000) Resp:  [18-20] 19  (12/22 0449) BP: (90-107)/(52-70) 90/52 mmHg (12/22 0449) SpO2:  [92 %-99 %] 99 % (12/22 0449) Weight:  [79 kg (174 lb 2.6 oz)-79.2 kg (174 lb 9.7 oz)] 79 kg (174 lb 2.6 oz) (12/22 0449)  Pre op weight 74 kg Current Weight  06/05/12 79 kg (174 lb 2.6 oz)      Intake/Output from previous day: 12/21 0701 - 12/22 0700 In: 840 [P.O.:840] Out: 1910 [Urine:1450; Chest Tube:460]   Physical Exam:  Cardiovascular: RRR, no murmurs, gallops, or rubs. Pulmonary: Diminished at right base; no rales, wheezes, or rhonchi. Abdomen: Soft, non tender, bowel sounds present. Extremities: Mild bilateral lower extremity edema. Wounds: Clean and dry.  No erythema or signs of infection.  Lab Results: CBC:  Basename 06/05/12 0513 06/04/12 0505  WBC 13.0* 13.6*  HGB 10.7* 10.6*  HCT 31.8* 30.9*  PLT 98* 90*   BMET:   Basename 06/04/12 0505 06/03/12 0425  NA 137 140  K 3.7 4.0  CL 101 108  CO2 27 23  GLUCOSE 116* 132*  BUN 16 15  CREATININE 0.96 0.90  CALCIUM 8.6 8.1*    PT/INR:  Lab Results  Component Value Date   INR 1.24 06/02/2012   INR 1.41 06/02/2012   INR 1.39 06/02/2012   ABG:  INR: Will add last result for INR, ABG once components are confirmed Will add last 4 CBG results once components are confirmed  Assessment/Plan:  1. CV - SR, 1st degree AV block. On Lopressor 12.5 bid with parameters. 2.  Pulmonary - Chest tubes are to suction.There is no air leak.Chest tubes with 460 cc (50 cc for first 12 hours) of  output last 24 hours. Will not remove today. Consider placing to water seal.Encourage incentive spirometer and flutter valve.On 3 liters of oxygen via [place]-wean as tolerates. 3. Volume Overload - On Lasix 40 daily 4.  Acute blood loss anemia - H and H stable at 10.7 and 31.8 this am 5.Thrombocytopenia-platelets increased to 98,000 6.LOC constipation  ZIMMERMAN,DONIELLE MPA-C 06/05/2012,7:52 AM  Significant chest tube drainage, of serosanguineous fluid, 2 drains remain Minimal pain Normal sinus rhythm patient examined and medical record reviewed,agree with above note. VAN TRIGT III,Stacee Earp 06/05/2012

## 2012-06-06 ENCOUNTER — Encounter (HOSPITAL_COMMUNITY): Payer: Self-pay | Admitting: Thoracic Surgery (Cardiothoracic Vascular Surgery)

## 2012-06-06 ENCOUNTER — Inpatient Hospital Stay (HOSPITAL_COMMUNITY): Payer: BC Managed Care – PPO

## 2012-06-06 LAB — TYPE AND SCREEN
Unit division: 0
Unit division: 0
Unit division: 0
Unit division: 0

## 2012-06-06 LAB — CBC
HCT: 32.2 % — ABNORMAL LOW (ref 39.0–52.0)
MCV: 90.7 fL (ref 78.0–100.0)
Platelets: 137 10*3/uL — ABNORMAL LOW (ref 150–400)
RBC: 3.55 MIL/uL — ABNORMAL LOW (ref 4.22–5.81)
RDW: 14.2 % (ref 11.5–15.5)
WBC: 9.6 10*3/uL (ref 4.0–10.5)

## 2012-06-06 LAB — BASIC METABOLIC PANEL
CO2: 29 mEq/L (ref 19–32)
Calcium: 9 mg/dL (ref 8.4–10.5)
Chloride: 102 mEq/L (ref 96–112)
Creatinine, Ser: 0.79 mg/dL (ref 0.50–1.35)
GFR calc Af Amer: >90 mL/min (ref >90)
Sodium: 139 mEq/L (ref 135–145)

## 2012-06-06 MED FILL — Sodium Chloride IV Soln 0.9%: INTRAVENOUS | Qty: 1000 | Status: AC

## 2012-06-06 MED FILL — Heparin Sodium (Porcine) Inj 1000 Unit/ML: INTRAMUSCULAR | Qty: 30 | Status: AC

## 2012-06-06 MED FILL — Mannitol IV Soln 20%: INTRAVENOUS | Qty: 500 | Status: AC

## 2012-06-06 MED FILL — Heparin Sodium (Porcine) Inj 1000 Unit/ML: INTRAMUSCULAR | Qty: 10 | Status: AC

## 2012-06-06 MED FILL — Electrolyte-R (PH 7.4) Solution: INTRAVENOUS | Qty: 5000 | Status: AC

## 2012-06-06 MED FILL — Sodium Bicarbonate IV Soln 8.4%: INTRAVENOUS | Qty: 50 | Status: AC

## 2012-06-06 MED FILL — Sodium Chloride Irrigation Soln 0.9%: Qty: 3000 | Status: AC

## 2012-06-06 MED FILL — Lidocaine HCl IV Inj 20 MG/ML: INTRAVENOUS | Qty: 5 | Status: AC

## 2012-06-06 NOTE — Progress Notes (Signed)
CARDIAC REHAB PHASE I   [phone]  Reviewed patients discharge education. Discussed orientation to outpatient cardiac rehab.  risk factors, sternal precautions,  heart health diet, exercise guidelines, restrictions, and use of pillow to cough, use of incentive spirometer and flutter valve. Patients wife in the room and both voiced understanding. Patient has been ambulating in the hall with his wife several times already and doing great. Will continue to follow up with patient as needed.     Melyssa Signor L. Aneesh Faller, MS, NASM, CES  Exercise Physiologist

## 2012-06-06 NOTE — Progress Notes (Signed)
Pt transferred to room 2004. Belongings sent with patient to new room. John Boyd

## 2012-06-06 NOTE — Progress Notes (Addendum)
[address]            [place] [address]          [phone]     4 Days Post-Op  Procedure(s) (LRB): EXPLORATION POST OPERATIVE OPEN HEART (N/A) Subjective: Feels ok  Objective  Telemetry sinus rhythm   Temp:  [98.2 F (36.8 C)-98.9 F (37.2 C)] 98.5 F (36.9 C) (12/23 0425) Pulse Rate:  [74-82] 74  (12/23 0445) Resp:  [18-20] 20  (12/22 1930) BP: (81-98)/(43-60) 98/60 mmHg (12/23 0445) SpO2:  [91 %-98 %] 91 % (12/23 0425) Weight:  [174 lb 6.1 oz (79.1 kg)] 174 lb 6.1 oz (79.1 kg) (12/23 0425)   Intake/Output Summary (Last 24 hours) at 06/06/12 1028 Last data filed at 06/06/12 0700  Gross per 24 hour  Intake    120 ml  Output   1575 ml  Net  -1455 ml       General appearance: alert, cooperative and no distress Heart: regular rate and rhythm Lungs: mildly dim right base Abdomen: soft, nontender, + BS Extremities: + edema Wound: incisions healing well  Lab Results:  Basename 06/04/12 0505  NA 137  K 3.7  CL 101  CO2 27  GLUCOSE 116*  BUN 16  CREATININE 0.96  CALCIUM 8.6  MG --  PHOS --   No results found for this basename: AST:2,ALT:2,ALKPHOS:2,BILITOT:2,PROT:2,ALBUMIN:2 in the last 72 hours No results found for this basename: LIPASE:2,AMYLASE:2 in the last 72 hours  Basename 06/06/12 0935 06/05/12 0513  WBC 9.6 13.0*  NEUTROABS -- --  HGB 10.6* 10.7*  HCT 32.2* 31.8*  MCV 90.7 89.8  PLT 137* 98*   No results found for this basename: CKTOTAL:4,CKMB:4,TROPONINI:4 in the last 72 hours No components found with this basename: POCBNP:3 No results found for this basename: DDIMER in the last 72 hours No results found for this basename: HGBA1C in the last 72 hours No results found for this basename: CHOL,HDL,LDLCALC,TRIG,CHOLHDL in the last 72 hours No results found for this basename: TSH,T4TOTAL,FREET3,T3FREE,THYROIDAB in the last 72 hours No results found for this basename:  VITAMINB12,FOLATE,FERRITIN,TIBC,IRON,RETICCTPCT in the last 72 hours  Medications: Scheduled    . acetaminophen  1,000 mg Oral Q6H  . aspirin EC  325 mg Oral Daily  . bisacodyl  10 mg Oral Daily   Or  . bisacodyl  10 mg Rectal Daily  . docusate sodium  200 mg Oral Daily  . furosemide  20 mg Intravenous Once  . furosemide  40 mg Oral Daily  . metoprolol tartrate  12.5 mg Oral BID  . pantoprazole  40 mg Oral Daily  . potassium chloride  20 mEq Oral Daily  . sodium chloride  3 mL Intravenous Q12H     Radiology/Studies:  Dg Chest 2 View  06/06/2012  *RADIOLOGY REPORT*  Clinical Data: Post aortic valve replacement, follow-up  CHEST - 2 VIEW  Comparison: Portable chest x-ray of 06/03/2012  Findings: One of the right chest tubes has been removed and there is a small right apical pneumothorax present.  Linear opacity in the right mid lung most likely represents atelectasis with probable atelectasis and small effusions present at the bases. The central venous line and Swan-Ganz catheter have been removed.  Mild cardiomegaly is stable.  An aortic valve prosthesis is noted.  IMPRESSION:  1.  One of two right chest tubes removed with small right apical pneumothorax noted.  2.  Probable left basilar atelectasis with small effusions. 3.  The central venous line and Swan-Ganz catheter have been removed.   Original Report Authenticated By: Dwyane Dee, M.D.     INR: Will add last result for INR, ABG once components are confirmed Will add last 4 CBG results once components are confirmed  Assessment/Plan: S/P Procedure(s) (LRB): EXPLORATION POST OPERATIVE OPEN HEART (N/A)  1. Doing well 2 chest tube- 325 cc/24 hrs, keep for now 3 labs stable 4 gentle diuresis- BP runs relatively low, monitor  LOS: 4 days    Boyd,John E 12/23/201310:28 AM    I have seen and examined the patient and agree with the assessment and plan as outlined.  Boyd,John H 06/06/2012 5:13 PM

## 2012-06-06 NOTE — Progress Notes (Signed)
Pt states he over did it this morning. Pt walked a lot around the unit and does not feel like walking this afternoon.

## 2012-06-06 NOTE — Progress Notes (Signed)
EPW d/c per MD order at 1115. All wires intact upon removal. Vital signs stable. BP 108/88. HR 80. Pt tolerated well. Bed rest until 1215. Call bell in reach. Dion Saucier

## 2012-06-06 NOTE — Progress Notes (Signed)
Pt ambulated 1800 ft with RW and wife. Pt tolerated ambulation well. Back to chair after ambulation. Dion Saucier

## 2012-06-07 ENCOUNTER — Inpatient Hospital Stay (HOSPITAL_COMMUNITY): Payer: BC Managed Care – PPO

## 2012-06-07 MED ORDER — POTASSIUM CHLORIDE CRYS ER 20 MEQ PO TBCR: 20.0000 meq | EXTENDED_RELEASE_TABLET | Freq: Every day | ORAL | Status: DC

## 2012-06-07 MED ORDER — ASPIRIN 325 MG PO TBEC: 325.0000 mg | DELAYED_RELEASE_TABLET | Freq: Every day | ORAL | Status: DC

## 2012-06-07 MED ORDER — FUROSEMIDE 40 MG PO TABS: 40.0000 mg | ORAL_TABLET | Freq: Every day | ORAL | Status: DC

## 2012-06-07 MED ORDER — OXYCODONE HCL 5 MG PO TABS: 5.0000 mg | ORAL_TABLET | ORAL | Status: DC | PRN

## 2012-06-07 MED ORDER — METOPROLOL TARTRATE 12.5 MG HALF TABLET: 12.5000 mg | ORAL_TABLET | Freq: Two times a day (BID) | ORAL | Status: DC

## 2012-06-07 NOTE — Discharge Summary (Signed)
I agree with the above discharge summary and plan for follow-up.     OWEN,CLARENCE H 06/07/2012 11:20 AM

## 2012-06-07 NOTE — Discharge Summary (Signed)
[address]            [place] [address]          [phone]      John Boyd October 10, 1950 61 y.o. [id number]  06/02/2012   Purcell Nails, MD  AS Post Operative Bleeding  HPI: At time of consultation Patient is a 61 year old married white male from [place] with known history of bicuspid aortic valve with aortic stenosis who has been referred to consider elective aortic valve replacement. The patient states that he was first noted to have a heart murmur on routine physical exam more than 15 years ago by his primary care physician. He was referred to Dr. [name] and found to have bicuspid aortic valve with mild aortic stenosis. He has been followed intermittently ever since on a regular basis. The patient first began developing symptoms of chest pain more than a year ago. Initially these symptoms only occurred when the patient was running or doing something very strenuous. Over the past 4-6 weeks the patient has developed significant acceleration of symptoms of exertional shortness of breath and fatigue. He was seen in followup by Dr. [name] in early October and an echocardiogram was performed demonstrating significant progression of disease with severe aortic stenosis including the velocity across the valve measured 419 cm/s corresponding to peak and mean transvalvular gradients of 70 and 42 mm mercury respectively. Left ventricular systolic function remains well preserved. The patient subsequently underwent left and right heart catheterization by Dr. [name] yesterday. These confirmed the presence of severe aortic stenosis with a mean transvalvular gradient measured at cath 48 mm mercury corresponding to a aortic valve area estimated 0.6 cm. Following catheterization the patient had a brief transient visual disturbance for which he underwent a brain CT scan and was seen in consultation by Dr. [name] from the neurology team. 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 The patient was referred to consider elective aortic valve replacement.  The patient reports a 4 to 6 week history of symptoms of significant exertional shortness of breath. He denies resting shortness of breath, PND, orthopnea, or lower extremity edema. He has had occasional dizzy spells without syncope. The patient still has episodes of chest tightness when he is running uphill which she has had for more than a year. He reports occasional fleeting episodes of chest pain at rest it seemed to come and goes sporadically. The patient was admitted this hospitalization for minimally invasive aortic valve replacement.  Past Medical History   Diagnosis  Date   .  Hypertension      x16 years   .  Hyperlipidemia    .  Bicuspid aortic valve    .  Migraine    .  Aortic stenosis  05/06/2012   .  Hematuria     Past Surgical History   Procedure  Date   .  Hip arthroscopy      left hip   .  Cardiac catheterization    .  Closed manipulation shoulder    .  Cystoscopy     Family History   Problem  Relation  Age of Onset   .  Coronary artery disease  Neg Hx       Early    .  Sudden death  Neg Hx    .  Heart disease  Mother    .  Heart disease  Paternal Grandfather     Social History  History   Substance Use Topics   .  Smoking status:  Never Smoker   .  Smokeless tobacco:  Never Used   .  Alcohol Use:  No    Prior to Admission medications   Medication  Sig  Start Date  End Date  Taking?  Authorizing Provider   aspirin 81 MG tablet  Take 81 mg by mouth daily.    Yes  Historical Provider, MD   cholecalciferol (VITAMIN D) 400 UNITS TABS  Take 400 Units by mouth daily.    Yes  Historical Provider, MD   desonide (DESOWEN) 0.05 % cream  Apply 1 application topically 2 (two) times daily as needed. rash    Yes  Historical Provider, MD   fluticasone (FLONASE) 50 MCG/ACT nasal spray  Place 1 spray into the nose daily as  needed. Nasal allergies    Yes  Historical Provider, MD   lisinopril (PRINIVIL,ZESTRIL) 20 MG tablet  Take 10 mg by mouth daily.    Yes  Historical Provider, MD   multivitamin [facility]) per tablet  Take 1 tablet by mouth daily.    Yes  Historical Provider, MD   No Known Allergies      Hospital Course:   The patient was admitted electively and on 06/02/2012 was taken to the operating room at which time he underwent the following procedure:  Date of Procedure: 06/02/2012  Preoperative Diagnosis: Severe Aortic Stenosis  Postoperative Diagnosis: Same  Procedure:  Minimally Invasive Aortic Valve Replacement  Edwards Magna Ease Pericardial Tissue Valve (size 23mm, model #[id number], serial #[id number]) Surgeon: Salvatore Decent. Cornelius Moras, MD  Assistant: Gershon Crane, PA-C  Anesthesia: Kipp Brood, MD  Operative Findings:  Bicuspid native aortic valve with severe aortic stenosis  Normal left ventricular systolic function The patient tolerated the procedure well. The patient was reintubated using a single lumen endotracheal tube and subsequently transported to the surgical intensive care unit in stable condition. There were no intraoperative complications. All sponge instrument and needle counts are verified correct at completion of the operation.  The post-bypass portion of the operation was notable for stable rhythm and hemodynamics.  No blood products were administered during the operation.   Postoperative hospital course; The patient's initial postoperative course was notable for significant bleeding requiring return to the operating room for reexploration. The following procedure was performed: CARDIOTHORACIC SURGERY OPERATIVE NOTE  Date of Procedure: 06/02/2012  Preoperative Diagnosis: Bleeding s/p minimally invasive aortic valve replacement  Postoperative Diagnosis: same  Procedure: Reexploration for bleeding  Surgeon: Salvatore Decent. Cornelius Moras, MD  Assistant: Marlou Porch, CRNFA  Anesthesia: Kipp Brood, MD  Operative Findings: Bleeding from intercostal vessels 3rd intercostal space  The patient tolerated the procedure well and is reintubated with a single-lumen endotracheal tube. The patient is transported back to the surgical intensive care unit in stable condition. There are no intraoperative complications   post operative course continued: Following return to the surgical intensive care unit the patient remained quite stable. He was weaned from the ventilator without difficulty. He maintained stable hemodynamics. All routine lines, monitors and drainage devices were discontinued in the standard fashion. After transfer from the intensive care unit to the step down unit for continued to make excellent ongoing progress. He has had no postoperative cardiac dysrhythmias. He has been weaned from oxygen and maintained good saturations on room air. Incisions are healing well without evidence of infection. He  does have a mild postoperative volume overload but has responded well to diuretics which will continue for a short time post discharge. His overall status is felt to be tentatively stable for discharge later this afternoon following removal of his last chest tubes if there is no associated issues and the chest x-ray is okay.   Basename 06/06/12 0935  NA 139  K 4.1  CL 102  CO2 29  GLUCOSE 122*  BUN 14  CALCIUM 9.0    Basename 06/06/12 0935 06/05/12 0513  WBC 9.6 13.0*  HGB 10.6* 10.7*  HCT 32.2* 31.8*  PLT 137* 98*   No results found for this basename: INR:2 in the last 72 hours   Discharge Instructions:  The patient is discharged to home with extensive instructions on wound care and progressive ambulation.  They are instructed not to drive or perform any heavy lifting until returning to see the physician in his office.  Discharge Diagnosis:  AS Post Operative Bleeding require reexploration and transfusion of blood and products.   Secondary Diagnosis: Patient Active Problem  List  Diagnosis  . HYPERLIPIDEMIA  . HYPERTENSION  . AORTIC VALVE DISEASE  . Visual field defect  . Ocular migraine  . Aortic stenosis  . Bicuspid aortic valve  . S/P aortic valve replacement with bioprosthetic valve   Past Medical History  Diagnosis Date  . Hypertension     x16 years  . Hyperlipidemia   . Bicuspid aortic valve   . Migraine   . Aortic stenosis 05/06/2012  . Hematuria   . S/P aortic valve replacement with bioprosthetic valve 06/02/2012    23mm Edwards Magna Ease pericardial tissue valve via right mini thoracotomy       Brydan, Downard  Home Medication Instructions [id number]   Printed on:06/07/12 1042  Medication Information                    fluticasone (FLONASE) 50 MCG/ACT nasal spray Place 1 spray into the nose daily as needed. Nasal allergies           multivitamin (THERAGRAN) per tablet Take 1 tablet by mouth daily.             desonide (DESOWEN) 0.05 % cream Apply 1 application topically 2 (two) times daily as needed. rash           cholecalciferol (VITAMIN D) 400 UNITS TABS Take 400 Units by mouth daily.           aspirin EC 325 MG EC tablet Take 1 tablet (325 mg total) by mouth daily.           furosemide (LASIX) 40 MG tablet Take 1 tablet (40 mg total) by mouth daily.           metoprolol tartrate (LOPRESSOR) 12.5 mg TABS Take 0.5 tablets (12.5 mg total) by mouth 2 (two) times daily.           oxyCODONE (OXY IR/ROXICODONE) 5 MG immediate release tablet Take 1-2 tablets (5-10 mg total) by mouth every 3 (three) hours as needed.           potassium chloride SA (K-DUR,KLOR-CON) 20 MEQ tablet Take 1 tablet (20 mEq total) by mouth daily.            Follow-up Information    Follow up with [name], MD. (2 weeks , call office to arrange)    Contact information:   [address] [place] [address]  [phone]       Follow up with Purcell Nails, MD. (Appointment to see the  physician assistant on June 6 at 1:30 PM. Please obtain a chest x-ray at 12:30 PM at [facility] imaging. [facility] is located in the same office complex)    Contact information:   [address] [place] [address] [phone]       Follow up with [facility] [place] NUR[facility]. (For suture removal on December 31 at 10 AM at Dr. Barry Dienes office.)         note-the patient will be discharged on aspirin and beta blocker. He does not require statin. ACE inhibitor has not been restarted as his blood pressure has been relatively low. Consideration for restarting we made in the office followup. Disposition: For discharge home  Patient's condition is Good  Gershon Crane, PA-C 06/07/2012  10:42 AM

## 2012-06-07 NOTE — Progress Notes (Signed)
CT x 2 DC'd per order and protocol.  Sutures secured and tied and vasoline gauze with 4x4 and tape applied.  STAT 2 view CXR ordered.  Will continue to monitor. Ave Filter

## 2012-06-07 NOTE — Progress Notes (Addendum)
[address]            [place] [address]          [phone]     5 Days Post-Op  Procedure(s) (LRB): EXPLORATION POST OPERATIVE OPEN HEART (N/A) Subjective: Feels ok, no new complaints, nausea improved   Objective  Telemetry sinus rhythm  Temp:  [97.8 F (36.6 C)-98.3 F (36.8 C)] 98.3 F (36.8 C) (12/24 0420) Pulse Rate:  [68-89] 79  (12/24 0420) Resp:  [18-20] 18  (12/24 0420) BP: (98-122)/(63-81) 110/69 mmHg (12/24 0420) SpO2:  [94 %-98 %] 94 % (12/24 0420) Weight:  [170 lb 12.8 oz (77.474 kg)] 170 lb 12.8 oz (77.474 kg) (12/24 0420)   Intake/Output Summary (Last 24 hours) at 06/07/12 0843 Last data filed at 06/07/12 0700  Gross per 24 hour  Intake    243 ml  Output   1135 ml  Net   -892 ml       General appearance: alert, cooperative and no distress Heart: regular rate and rhythm and soft systolic murmur Lungs: mildly dim in the bases Abdomen: soft, nontender Extremities: no significant edema Wound: incisions healing well  Lab Results:  Basename 06/06/12 0935  NA 139  K 4.1  CL 102  CO2 29  GLUCOSE 122*  BUN 14  CREATININE 0.79  CALCIUM 9.0  MG --  PHOS --   No results found for this basename: AST:2,ALT:2,ALKPHOS:2,BILITOT:2,PROT:2,ALBUMIN:2 in the last 72 hours No results found for this basename: LIPASE:2,AMYLASE:2 in the last 72 hours  Basename 06/06/12 0935 06/05/12 0513  WBC 9.6 13.0*  NEUTROABS -- --  HGB 10.6* 10.7*  HCT 32.2* 31.8*  MCV 90.7 89.8  PLT 137* 98*   No results found for this basename: CKTOTAL:4,CKMB:4,TROPONINI:4 in the last 72 hours No components found with this basename: POCBNP:3 No results found for this basename: DDIMER in the last 72 hours No results found for this basename: HGBA1C in the last 72 hours No results found for this basename: CHOL,HDL,LDLCALC,TRIG,CHOLHDL in the last 72 hours No results found for this basename: TSH,T4TOTAL,FREET3,T3FREE,THYROIDAB in the last 72  hours No results found for this basename: VITAMINB12,FOLATE,FERRITIN,TIBC,IRON,RETICCTPCT in the last 72 hours  Medications: Scheduled    . acetaminophen  1,000 mg Oral Q6H  . aspirin EC  325 mg Oral Daily  . bisacodyl  10 mg Oral Daily   Or  . bisacodyl  10 mg Rectal Daily  . docusate sodium  200 mg Oral Daily  . furosemide  20 mg Intravenous Once  . furosemide  40 mg Oral Daily  . metoprolol tartrate  12.5 mg Oral BID  . pantoprazole  40 mg Oral Daily  . potassium chloride  20 mEq Oral Daily  . sodium chloride  3 mL Intravenous Q12H     Radiology/Studies:  Dg Chest 2 View  06/06/2012  *RADIOLOGY REPORT*  Clinical Data: Post aortic valve replacement, follow-up  CHEST - 2 VIEW  Comparison: Portable chest x-ray of 06/03/2012  Findings: One of the right chest tubes has been removed and there is a small right apical pneumothorax present.  Linear opacity in the right mid lung most likely represents atelectasis with probable atelectasis and small effusions present at the bases. The central venous line and Swan-Ganz catheter have been removed.  Mild cardiomegaly is stable.  An aortic valve prosthesis is noted.  IMPRESSION:  1.  One of two  right chest tubes removed with small right apical pneumothorax noted. 2.  Probable left basilar atelectasis with small effusions. 3.  The central venous line and Swan-Ganz catheter have been removed.   Original Report Authenticated By: Dwyane Dee, M.D.     INR: Will add last result for INR, ABG once components are confirmed Will add last 4 CBG results once components are confirmed   Chest tube 110 cc, no air leak   Assessment/Plan: S/P Procedure(s) (LRB): EXPLORATION POST OPERATIVE OPEN HEART (N/A)  1 doing well overall 2 d/c chest tubes today 3 home this afternoon if cxr ok    LOS: 5 days    GOLD,WAYNE E 12/24/20138:43 AM    I have seen and examined the patient and agree with the assessment and plan as outlined.  D/C instructions  given.  OWEN,CLARENCE H 06/07/2012 9:23 AM

## 2012-06-07 NOTE — Progress Notes (Signed)
Pt ambulated with RW and wife 1200 ft, tolerated very well, back to room.  Will continue to monitor and encourage. Ave Filter

## 2012-06-07 NOTE — Progress Notes (Signed)
CARDIAC REHAB PHASE I   PRE:  Rate/Rhythm: 84 SR   BP:  Supine:   Sitting: 98/62  Standing:    SaO2: 100 RA  MODE:  Ambulation: 350 ft   POST:  Rate/Rhythem: 90 SR   BP:  Supine:   Sitting: 118/62  Standing:    SaO2: 98 RA Patient ambulated x1 assist with RW to hold CT. Patient will not need RW for home. Pt doing great. Plans to have order for outpatient cardiac rehab sent to [facility] but may need to transfer to [facility] for closer to home reasons.  Patient in recliner with call bell in reach. [phone]   Andi [place], Toni Amend

## 2012-06-09 ENCOUNTER — Ambulatory Visit (INDEPENDENT_AMBULATORY_CARE_PROVIDER_SITE_OTHER): Payer: Self-pay | Admitting: Physician Assistant

## 2012-06-09 ENCOUNTER — Encounter: Payer: Self-pay | Admitting: Physician Assistant

## 2012-06-09 VITALS — BP 128/72 | HR 78 | Temp 97.4°F | Resp 18 | Ht 69.0 in | Wt 170.0 lb

## 2012-06-09 DIAGNOSIS — I359 Nonrheumatic aortic valve disorder, unspecified: Secondary | ICD-10-CM

## 2012-06-09 DIAGNOSIS — Z09 Encounter for follow-up examination after completed treatment for conditions other than malignant neoplasm: Secondary | ICD-10-CM

## 2012-06-09 DIAGNOSIS — T8131XA Disruption of external operation (surgical) wound, not elsewhere classified, initial encounter: Secondary | ICD-10-CM

## 2012-06-09 DIAGNOSIS — T8130XA Disruption of wound, unspecified, initial encounter: Secondary | ICD-10-CM

## 2012-06-09 DIAGNOSIS — Z952 Presence of prosthetic heart valve: Secondary | ICD-10-CM

## 2012-06-09 DIAGNOSIS — Z954 Presence of other heart-valve replacement: Secondary | ICD-10-CM

## 2012-06-09 DIAGNOSIS — I350 Nonrheumatic aortic (valve) stenosis: Secondary | ICD-10-CM

## 2012-06-09 NOTE — Progress Notes (Signed)
[address]            [place] [address]          [phone]     HPI: Mr. John Boyd is status post a minimally invasive AVR by Dr. Cornelius Moras on 06/02/2012. His postoperative course was notable for a re-exploration for bleeding. He overall did well, and was discharged on 06/07/2012.  His final 2 chest tubes were removed on the day of discharge.  Yesterday, he had a coughing spell, after which he noted drainage from the center chest tube site.  Upon further examination, his chest tube suture was noted to be loosely tied, and the site was open.  His wife was concerned that the wound was "tracking", and contacted the doctor on call.  He was instructed to call if anything changed.  He remained concerned, and he presented to the office this morning for further evaluation.  He denies any fevers, chills, or purulent drainage. He states that the area looks better today, and has only drained a small amount.     Current Outpatient Prescriptions  Medication Sig Dispense Refill  . aspirin EC 325 MG EC tablet Take 1 tablet (325 mg total) by mouth daily.  30 tablet    . cholecalciferol (VITAMIN D) 400 UNITS TABS Take 400 Units by mouth daily.      Marland Kitchen desonide (DESOWEN) 0.05 % cream Apply 1 application topically 2 (two) times daily as needed. rash      . fluticasone (FLONASE) 50 MCG/ACT nasal spray Place 1 spray into the nose daily as needed. Nasal allergies      . furosemide (LASIX) 40 MG tablet Take 1 tablet (40 mg total) by mouth daily.  5 tablet  0  . metoprolol tartrate (LOPRESSOR) 12.5 mg TABS Take 0.5 tablets (12.5 mg total) by mouth 2 (two) times daily.  60 each  1  . multivitamin (THERAGRAN) per tablet Take 1 tablet by mouth daily.        Marland Kitchen oxyCODONE (OXY IR/ROXICODONE) 5 MG immediate release tablet Take 1-2 tablets (5-10 mg total) by mouth every 3 (three) hours as needed.  50 tablet  0  . potassium chloride SA (K-DUR,KLOR-CON) 20 MEQ tablet Take 1 tablet (20 mEq total)  by mouth daily.  5 tablet  0     Physical Exam: BP 128/72 HR 78 Resp 18 Temp 97.4 Wounds: His right thoracotomy and right groin incisions are healing well.  The central chest tube site is superficially separated.  There is no surrounding erythema, and no purulence is expressible.  There is a small amount of serous fluid on the dressing.  The remaining chest tube sites are clean, dry and well approximated. Heart: RRR Lungs: Clear Extremities: Minimal edema   Diagnostic Tests: Chest xray: Dg Chest 2 View  06/07/2012  *RADIOLOGY REPORT*  Clinical Data: Chest tube removal  CHEST - 2 VIEW  Comparison: June 06, 2012  Findings:  Chest tube is been removed on the right.  Small right apical pneumothorax persists.  Postoperative change on the right with scarring in the right mid lower lung zones persist.  There is minimal scarring in the left base.  Lungs are otherwise clear. Heart size and pulmonary vascularity are within normal limits.  The patient is status post aortic valve replacement.  IMPRESSION: Small pneumothorax on the right in the apex region persists following chest tube removal.  The pneumothorax has not increased in size, however.  There is postoperative change as stable. There is scarring with volume loss in the right lower lung zone, stable.  Minimal scarring left base.  No new opacity.   Original Report Authenticated By: Bretta Bang, M.D.        Assessment/Plan: Mr. [name] had had some superficial separation of his center chest tube site without signs of infection.  My guess is that his coughing allowed some serous drainage through the tract.  I placed a steri-strip over the area and instructed him to call if there was any redness, fever, or purulent drainage.  He also was unclear about the instructions for his metoprolol, and we reviewed this. He will follow up as directed on Monday for suture removal and wound recheck with the nurse, and will call in the interim with  further question.

## 2012-06-13 ENCOUNTER — Other Ambulatory Visit: Payer: Self-pay | Admitting: *Deleted

## 2012-06-13 DIAGNOSIS — I350 Nonrheumatic aortic (valve) stenosis: Secondary | ICD-10-CM

## 2012-06-14 ENCOUNTER — Ambulatory Visit (INDEPENDENT_AMBULATORY_CARE_PROVIDER_SITE_OTHER): Payer: Self-pay | Admitting: *Deleted

## 2012-06-14 VITALS — BP 89/66 | HR 78 | Resp 16

## 2012-06-14 DIAGNOSIS — Z4802 Encounter for removal of sutures: Secondary | ICD-10-CM

## 2012-06-14 DIAGNOSIS — I359 Nonrheumatic aortic valve disorder, unspecified: Secondary | ICD-10-CM

## 2012-06-14 DIAGNOSIS — I350 Nonrheumatic aortic (valve) stenosis: Secondary | ICD-10-CM

## 2012-06-14 NOTE — Progress Notes (Signed)
Mr. Tabar returns for suture removal of two of his three previous chest tube sites after mini atrial valve replacement.  I removed a scab from one site that he said was draining and probed this area. This left a hole the size of the head of a Q-tip.  The other site has some eschar, but no signs of infection.  I suggested soap and water cleansing until seen next Monday and then we would readdress them again.  He's also concerned about his low BP ranging in the 90-100's systolic and feeling "washed out".  I suggested he consult with Dr. [name]. He is only taking Metoprolol 12.5 mgm b.i.d.  His pulse rate ranges from the 60's to the low 90's.  I suggested he could reduce his Metoprolol to daily, but should consult Dr. [name] and he agreed.

## 2012-06-17 ENCOUNTER — Encounter: Payer: Self-pay | Admitting: Thoracic Surgery (Cardiothoracic Vascular Surgery)

## 2012-06-20 ENCOUNTER — Ambulatory Visit (INDEPENDENT_AMBULATORY_CARE_PROVIDER_SITE_OTHER): Payer: Self-pay | Admitting: Physician Assistant

## 2012-06-20 ENCOUNTER — Ambulatory Visit
Admission: RE | Admit: 2012-06-20 | Discharge: 2012-06-20 | Disposition: A | Discharge status: Home / Self Care | Payer: BC Managed Care – PPO | Source: Ambulatory Visit | Attending: Thoracic Surgery (Cardiothoracic Vascular Surgery) | Admitting: Thoracic Surgery (Cardiothoracic Vascular Surgery)

## 2012-06-20 VITALS — BP 103/79 | HR 76 | Resp 18 | Ht 69.0 in | Wt 170.0 lb

## 2012-06-20 DIAGNOSIS — I359 Nonrheumatic aortic valve disorder, unspecified: Secondary | ICD-10-CM

## 2012-06-20 DIAGNOSIS — I350 Nonrheumatic aortic (valve) stenosis: Secondary | ICD-10-CM

## 2012-06-20 DIAGNOSIS — Z954 Presence of other heart-valve replacement: Secondary | ICD-10-CM

## 2012-06-20 DIAGNOSIS — Z952 Presence of prosthetic heart valve: Secondary | ICD-10-CM

## 2012-06-20 NOTE — Progress Notes (Signed)
HPI:  Patient returns for routine postoperative follow-up having undergone Minimally Invasive Aortic Valve Replacement on 06/02/2012. The patient's early postoperative recovery while in the hospital was notable for post operative bleeding requiring re-exploration in the operating room with ligation of an intercostal blood vessel.  The rest of his hospital stay was unremarkable.  The patient has been seen in our office for wound checks of his chest tube sites and suture removal.  His chest tube sites were draining clear drainage and the patients wife was concerned the wound was tracking.  Evaluation did not show evidence of infection.  Since hospital discharge the patient reports he is doing okay.  He states he thought he would feel better than he does.  He complains of having discomfort and decreased range of motion of his RUE.  He also states that he has numbness and tingling and sharp "zapping" pains on his right chest.  He states that he does not feel he has that much energy and he is not ambulating that much.  Finally he is concerned that his heart rate is faster than it was before surgery running as high as 100 and his blood pressure has been running low sometimes in the 80s.  The patient states that he has decreased his Lopressor dose to 1/2 tablet once daily.   Current Outpatient Prescriptions  Medication Sig Dispense Refill  . aspirin EC 325 MG EC tablet Take 1 tablet (325 mg total) by mouth daily.  30 tablet    . cholecalciferol (VITAMIN D) 400 UNITS TABS Take 400 Units by mouth daily.      Marland Kitchen desonide (DESOWEN) 0.05 % cream Apply 1 application topically 2 (two) times daily as needed. rash      . fluticasone (FLONASE) 50 MCG/ACT nasal spray Place 1 spray into the nose daily as needed. Nasal allergies      . metoprolol tartrate (LOPRESSOR) 12.5 mg TABS Take 0.5 tablets (12.5 mg total) by mouth 2 (two) times daily.  60 each  1  . multivitamin (THERAGRAN) per tablet Take 1 tablet by mouth daily.         Marland Kitchen oxyCODONE (OXY IR/ROXICODONE) 5 MG immediate release tablet Take 1-2 tablets (5-10 mg total) by mouth every 3 (three) hours as needed.  50 tablet  0  . furosemide (LASIX) 40 MG tablet Take 1 tablet (40 mg total) by mouth daily.  5 tablet  0  . potassium chloride SA (K-DUR,KLOR-CON) 20 MEQ tablet Take 1 tablet (20 mEq total) by mouth daily.  5 tablet  0    Physical Exam:  BP 103/79  Pulse 76  Resp 18  Ht 5\' 9"  (1.753 m)  Wt 170 lb (77.111 kg)  BMI 25.10 kg/m2  SpO2 98%  Gen: no apparent distress Heart: RRR Lungs: CTA bilaterally Skin: incision clean and dry, there is some bruising present along middle portion of chest and right axilla, this area continues to appear swollen from surgery.  Diagnostic Tests:  CXR: Resolution of right sided pneumothorax, no significant pleural effusion  Impression  Mr. Kazlauskas is S/P AVR.  He is doing well considering his complaints.  I explained to the patient that it takes approximately 2-3 months before he will be completely healed and back to 100%.  In regards to his fatigue I feel this may likely be due to his use of Lopressor.  The patient's heart rate is 76 but his pressure is 103 SBP and patient states has been getting as low as the 80s.  In regards to the patient's activity level, I encouraged him to continue ambulating as tolerated.  In regards to patients concern regarding fast heart rate I explained that it will take a few weeks for his heart to stabilize from surgery and it is not abnormal for his heart rate to be above 60.     Plan:  I think the patient would benefit from discontinuation of his Beta Blocker.  I feel this would help with the patient feeling so fatigued and not having energy.  However we will defer this decision to Dr. Jenene Slicker office being the patient is scheduled for follow up tomorrow.  I encouraged the patient to take Ibuprofen for his discomfort along his right chest which could help with some of the inflammation  present.  The patient is to continue to increase his activity level as tolerated.  We will bring the patient back in 4 weeks to follow up with Dr. Cornelius Moras.  He was instructed to call our office should he continue to feel fatigued and that he is not making progress in his recovery.  The patient was agreeable to this.

## 2012-06-21 ENCOUNTER — Ambulatory Visit (INDEPENDENT_AMBULATORY_CARE_PROVIDER_SITE_OTHER): Payer: BC Managed Care – PPO | Admitting: Physician Assistant

## 2012-06-21 ENCOUNTER — Encounter: Payer: Self-pay | Admitting: Physician Assistant

## 2012-06-21 VITALS — BP 128/88 | HR 58 | Ht 69.0 in | Wt 161.8 lb

## 2012-06-21 DIAGNOSIS — I10 Essential (primary) hypertension: Secondary | ICD-10-CM

## 2012-06-21 DIAGNOSIS — Z954 Presence of other heart-valve replacement: Secondary | ICD-10-CM

## 2012-06-21 DIAGNOSIS — Z952 Presence of prosthetic heart valve: Secondary | ICD-10-CM

## 2012-06-21 DIAGNOSIS — I359 Nonrheumatic aortic valve disorder, unspecified: Secondary | ICD-10-CM

## 2012-06-21 MED ORDER — IBUPROFEN 200 MG PO CAPS: 800.0000 mg | ORAL_CAPSULE | ORAL | Status: DC | PRN

## 2012-06-21 MED ORDER — METOPROLOL TARTRATE 12.5 MG HALF TABLET: 12.5000 mg | ORAL_TABLET | Freq: Every day | ORAL | Status: DC

## 2012-06-21 NOTE — Patient Instructions (Addendum)
CHANGE TAKING METOPROLOL TO BED TIME , TRY THIS FOR A COUPLE OF WEEKS IF THIS NOT MAKE YOU FEEL BETTER PLEASE CALL [facility], [facility] [phone]  Your physician has requested that you have an echocardiogram. Echocardiography is a painless test that uses sound waves to create images of your heart. It provides your doctor with information about the size and shape of your heart and how well your heart's chambers and valves are working. This procedure takes approximately one hour. There are no restrictions for this procedure.  You have been referred to CARDIAC REHAB TO BE DONE AT [facility]  KEEP FOLLOW UP APPT WITH DR. [facility] 08/2012

## 2012-06-21 NOTE — Progress Notes (Signed)
[address] [place], [place]  [address] Phone: [phone], Fax:  [phone]  Date:  06/21/2012   Name:  John Boyd   DOB:  [birth year]   MRN:  [id number]  PCP:  John Rotunda, MD  Primary Cardiologist:  Dr. Rollene Boyd  Primary Electrophysiologist:  None    History of Present Illness: John Boyd is a 62 y.o. male who returns for followup after recent admission to the hospital for aortic valve replacement.  He has a history of bicuspid aortic valve with aortic stenosis, HTN and HL. He was recently seen in followup and noted to have progression of his aortic stenosis. Echo 10/13: Mild LVH, EF 60-65%, mean aortic valve gradient 42. R/L HC 11/13: Mean aortic valve gradient 47.93, normal coronary arteries, EF 55-65%. He had some visual disturbance after his catheterization. Head CT was normal. 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 Patient was referred for aortic valve replacement. He was admitted 12/19-12/24. He underwent minimally invasive aortic valve replacement with Dr. Tressie Boyd. She had a pericardial tissue valve placed. Patient had some post op bleeding that required re-exploration.  Otherwise, postoperative course was fairly uneventful. He remained in NSR. He has been seen in followup by cardiac surgery. He has complained of fatigue. There were also some concerns about drainage from his chest tube sites. When last seen, recommendation was made to consider discontinuing the beta blocker. This was left up to cardiology.  Patient overall doing okay. Chest is still somewhat sore. He does note fatigue. This is improving since lowering his beta blocker dose to once daily. He notes a nonproductive cough. He denies significant dyspnea he denies orthopnea, PND or edema. He denies syncope.  Labs (12/13):    K 4.1, creatinine 0.79, Hgb 10.6 Pre-CABG Dopplers 12/13: Negative for ICA  stenosis  Chest x-ray 06/20/12: IMPRESSION:  Pneumothorax noted on prior exam has resolved. Right perihilar subsegmental atelectasis is noted which is improved compared to prior exam. No other acute abnormality seen.  Wt Readings from Last 3 Encounters:  06/20/12 170 lb (77.111 kg)  06/09/12 170 lb (77.111 kg)  06/07/12 170 lb 12.8 oz (77.474 kg)     Past Medical History  Diagnosis Date  . Hypertension     x16 years  . Hyperlipidemia   . Bicuspid aortic valve   . Migraine   . Aortic stenosis 05/06/2012  . Hematuria   . S/P aortic valve replacement with bioprosthetic valve 06/02/2012    23mm [facility] Ease pericardial tissue valve via right mini thoracotomy    Current Outpatient Prescriptions  Medication Sig Dispense Refill  . aspirin EC 325 MG EC tablet Take 1 tablet (325 mg total) by mouth daily.  30 tablet    . cholecalciferol (VITAMIN D) 400 UNITS TABS Take 400 Units by mouth daily.      Marland Kitchen desonide (DESOWEN) 0.05 % cream Apply 1 application topically 2 (two) times daily as needed. rash      . fluticasone (FLONASE) 50 MCG/ACT nasal spray Place 1 spray into the nose daily as needed. Nasal allergies      . furosemide (LASIX) 40 MG tablet Take 1 tablet (40 mg total) by mouth daily.  5 tablet  0  . metoprolol tartrate (LOPRESSOR) 12.5 mg TABS Take 0.5 tablets (12.5 mg total) by mouth 2 (two) times daily.  60 each  1  . multivitamin (THERAGRAN) per tablet Take 1 tablet  by mouth daily.        Marland Kitchen oxyCODONE (OXY IR/ROXICODONE) 5 MG immediate release tablet Take 1-2 tablets (5-10 mg total) by mouth every 3 (three) hours as needed.  50 tablet  0  . potassium chloride SA (K-DUR,KLOR-CON) 20 MEQ tablet Take 1 tablet (20 mEq total) by mouth daily.  5 tablet  0    Allergies:  No Known Allergies  Social History:  The patient  reports that he has never smoked. He has never used smokeless tobacco. He reports that he does not drink alcohol or use illicit drugs.   ROS:  Please see the  history of present illness.      All other systems reviewed and negative.   PHYSICAL EXAM: VS:  BP 128/88  Pulse 58  Ht 5\' 9"  (1.753 m)  Wt 161 lb 12.8 oz (73.392 kg)  BMI 23.89 kg/m2 Well nourished, well developed, in no acute distress HEENT: normal Neck: no JVD Cardiac:  normal S1, S2; RRR; 1/6 systolic murmur at the RUSB Chest: Right upper chest scar well-healed without erythema or discharge Lungs:  clear to auscultation bilaterally, no wheezing, rhonchi or rales Abd: soft, nontender, no hepatomegaly Ext: no edema Skin: warm and dry Neuro:  CNs 2-12 intact, no focal abnormalities noted  EKG:  Sinus bradycardia, HR 56, normal axis, no ischemic changes      ASSESSMENT AND PLAN:  1. Aortic Stenosis, status post Tissue AVR:  Patient doing well. His symptoms of fatigue are improving. Suspect a lot of this is from deconditioning. I have asked him to take his metoprolol at bedtime to see if this helps. If he continues to have significant fatigue, he will contact us. At that point, I would suggest that he stop his metoprolol. He can continue on aspirin. Obtain followup echocardiogram post surgery. Arrange cardiac rehabilitation at [facility]. We discussed SBE prophylaxis.  2. Hypertension:   Controlled.  3. Disposition:   Followup with Dr. Antoine Boyd as planned in 08/2012.  Luna Glasgow, PA-C  11:38 AM 06/21/2012

## 2012-06-27 ENCOUNTER — Telehealth: Payer: Self-pay | Admitting: Cardiology

## 2012-06-27 NOTE — Telephone Encounter (Signed)
Spoke to patient's wife she stated husband wants to go to [facility] cardiac rehab.States she spoke to Clydie Braun at [facility] cardiac rehab phone # [phone] # [phone].States Clydie Braun needs referral form and hospital notes.Message sent to Dr.Hochrein's nurse.

## 2012-06-27 NOTE — Telephone Encounter (Signed)
New Problem:    Patient's wife called in needing to speak to someone about switching her husbands Rehab to [facility].  Please call back.

## 2012-06-28 NOTE — Telephone Encounter (Signed)
Paperwork completed and placed on MD's desk to be signed

## 2012-06-29 NOTE — Telephone Encounter (Signed)
Paperwork signes and given to MR to be faxed and scanned into system

## 2012-07-01 ENCOUNTER — Telehealth: Payer: Self-pay | Admitting: Cardiology

## 2012-07-01 NOTE — Telephone Encounter (Signed)
Completed fax was sent to [facility] on June 29, 2012.  I spoke with pt's wife and gave her this information.

## 2012-07-01 NOTE — Telephone Encounter (Signed)
New Problem:    Patient's wife called in to follow-up on her husband going to [facility] for Cardiac Rehab.  Please call back.

## 2012-07-18 ENCOUNTER — Encounter: Payer: Self-pay | Admitting: Thoracic Surgery (Cardiothoracic Vascular Surgery)

## 2012-07-18 ENCOUNTER — Ambulatory Visit (HOSPITAL_COMMUNITY): Payer: BC Managed Care – PPO | Attending: Cardiovascular Disease | Admitting: Radiology

## 2012-07-18 ENCOUNTER — Ambulatory Visit (INDEPENDENT_AMBULATORY_CARE_PROVIDER_SITE_OTHER): Payer: Self-pay | Admitting: Thoracic Surgery (Cardiothoracic Vascular Surgery)

## 2012-07-18 VITALS — BP 137/88 | HR 67 | Resp 18 | Ht 69.0 in | Wt 165.0 lb

## 2012-07-18 DIAGNOSIS — I359 Nonrheumatic aortic valve disorder, unspecified: Secondary | ICD-10-CM

## 2012-07-18 DIAGNOSIS — Z954 Presence of other heart-valve replacement: Secondary | ICD-10-CM

## 2012-07-18 DIAGNOSIS — Q231 Congenital insufficiency of aortic valve: Secondary | ICD-10-CM | POA: Insufficient documentation

## 2012-07-18 DIAGNOSIS — Z952 Presence of prosthetic heart valve: Secondary | ICD-10-CM

## 2012-07-18 DIAGNOSIS — Z953 Presence of xenogenic heart valve: Secondary | ICD-10-CM

## 2012-07-18 DIAGNOSIS — E785 Hyperlipidemia, unspecified: Secondary | ICD-10-CM | POA: Insufficient documentation

## 2012-07-18 DIAGNOSIS — I10 Essential (primary) hypertension: Secondary | ICD-10-CM | POA: Insufficient documentation

## 2012-07-18 DIAGNOSIS — I350 Nonrheumatic aortic (valve) stenosis: Secondary | ICD-10-CM

## 2012-07-18 NOTE — Progress Notes (Signed)
Echocardiogram performed.  

## 2012-07-18 NOTE — Patient Instructions (Signed)
The patient may continue to gradually increase their physical activity as tolerated.  They should refrain from any heavy lifting or strenuous use of their arms and shoulders until at least 8 weeks from the time of their surgery, and they should avoid activities that cause increased pain in their chest on the side of their surgical incision.  Otherwise they may continue to increase their activities without any particular limitations.  

## 2012-07-18 NOTE — Progress Notes (Signed)
[address]            [place] [address]          [phone]     CARDIOTHORACIC SURGERY OFFICE NOTE  Referring Provider is [name], MD   HPI:  Patient returns for followup status post minimally invasive aortic valve replacement on 06/02/2012. He was last seen here in our office on 06/20/2012.  Since then he has done very well. He reports mild constant soreness across his right anterior chest related to his surgery.  This pain is there all the time and associated with some numbness of the skin and hyperesthesia. It does not bother him much and he now only occasionally will take Advil when it seems to be hurting. He has not required any more powerful pain relievers for more than 3 weeks. He is actively participating in the cardiac rehabilitation program and doing quite well. He states that while he is on the machines the attendants at the cardiac rehabilitation program have told him to cut back on his pace when his heart rate reaches 140 despite the fact that he feels quite good. He's not having any shortness of breath. He's not having any dizzy spells nor tachypalpitations.  His appetite is good. He is sleeping well at night. Overall he is pleased with his progress at this time.   Current Outpatient Prescriptions  Medication Sig Dispense Refill  . aspirin EC 325 MG EC tablet Take 1 tablet (325 mg total) by mouth daily.  30 tablet    . cholecalciferol (VITAMIN D) 400 UNITS TABS Take 400 Units by mouth daily.      Marland Kitchen desonide (DESOWEN) 0.05 % cream Apply 1 application topically 2 (two) times daily as needed. rash      . fluticasone (FLONASE) 50 MCG/ACT nasal spray Place 1 spray into the nose daily as needed. Nasal allergies      . Ibuprofen (ADVIL) 200 MG CAPS Take 4 capsules (800 mg total) by mouth as needed.      . metoprolol tartrate (LOPRESSOR) 12.5 mg TABS Take 0.5 tablets (12.5 mg total) by mouth at bedtime.      . multivitamin (THERAGRAN) per  tablet Take 1 tablet by mouth daily.            Physical Exam:   BP 137/88  Pulse 67  Resp 18  Ht 5\' 9"  (1.753 m)  Wt 165 lb (74.844 kg)  BMI 24.37 kg/m2  SpO2 99%  General:  Well-appearing  Chest:   Clear to auscultation with symmetrical breath sounds  CV:   Regular rate and rhythm  Incisions:  Clean dry and healing nicely  Abdomen:  Soft and nontender  Extremities:  Warm and well-perfused  Diagnostic Tests:  n/a   Impression:  Patient is progressing very well following recent minimally invasive aortic valve replacement. He is scheduled for a followup echocardiogram later today.  Plan:  I've encouraged patient to continue to gradually increase his physical activity as tolerated. I think he can resume driving an automobile. I've cautioned him to avoid any heavy lifting or strenuous use of his onto shoulders, particularly if it involves lifting his right arm above his head. We have not made any changes in his current medications. I think he could go back to work at any time as long as he appears to some minor physical restrictions. 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  We will plan to see him back in 2 months to make sure that he continues to do well. All his questions been addressed.    Salvatore Decent. Cornelius Moras, MD 07/18/2012 7:38 PM

## 2012-07-19 ENCOUNTER — Telehealth: Payer: Self-pay | Admitting: Cardiology

## 2012-07-19 ENCOUNTER — Telehealth: Payer: Self-pay | Admitting: *Deleted

## 2012-07-19 DIAGNOSIS — I059 Rheumatic mitral valve disease, unspecified: Secondary | ICD-10-CM

## 2012-07-19 MED ORDER — AMOXICILLIN 500 MG PO CAPS: ORAL_CAPSULE | ORAL | Status: DC

## 2012-07-19 NOTE — Telephone Encounter (Signed)
pt notified about echo results with verbal understanding today 

## 2012-07-19 NOTE — Telephone Encounter (Signed)
Message copied by Tarri Fuller on Tue Jul 19, 2012 10:49 AM ------      Message from: [place], [place] T      Created: Mon Jul 18, 2012  5:34 PM       Echo ok with      Normal LV function and AVR ok.      Tereso Newcomer, PA-C 07/18/2012 5:34 PM

## 2012-07-19 NOTE — Telephone Encounter (Signed)
Pt is not allergic to any antibiotics

## 2012-07-19 NOTE — Telephone Encounter (Signed)
New Phone    Pt called dentist about making an appt (2/6) and dentist requested Dr. [name] order the first round of antibiotics.  Pharmacy [facility] in [place]: [phone]

## 2012-07-21 ENCOUNTER — Telehealth: Payer: Self-pay | Admitting: Cardiology

## 2012-07-21 NOTE — Telephone Encounter (Signed)
New Problem    Pt states he is having a root canal on Monday and wanted to notify Dr. [name]. Please notify pt of any possible concerns.

## 2012-07-21 NOTE — Telephone Encounter (Signed)
Pt was given a RX for amoxicillin 500 mg #4 to take 1 hour before procedure.  Will forward to Dr [name] for review.

## 2012-07-22 ENCOUNTER — Ambulatory Visit: Payer: BC Managed Care – PPO | Admitting: Cardiology

## 2012-07-22 ENCOUNTER — Other Ambulatory Visit (HOSPITAL_COMMUNITY): Payer: BC Managed Care – PPO

## 2012-07-30 ENCOUNTER — Other Ambulatory Visit: Payer: Self-pay

## 2012-08-21 ENCOUNTER — Encounter: Payer: Self-pay | Admitting: Cardiology

## 2012-09-05 ENCOUNTER — Ambulatory Visit (INDEPENDENT_AMBULATORY_CARE_PROVIDER_SITE_OTHER): Payer: BC Managed Care – PPO | Admitting: Cardiology

## 2012-09-05 ENCOUNTER — Encounter: Payer: Self-pay | Admitting: Cardiology

## 2012-09-05 VITALS — BP 132/75 | HR 53 | Ht 69.0 in | Wt 160.0 lb

## 2012-09-05 DIAGNOSIS — I350 Nonrheumatic aortic (valve) stenosis: Secondary | ICD-10-CM

## 2012-09-05 DIAGNOSIS — I359 Nonrheumatic aortic valve disorder, unspecified: Secondary | ICD-10-CM

## 2012-09-05 NOTE — Progress Notes (Signed)
HPI The patient presents for followup of aortic stenosis s/p AVR.  Since I last saw him he has done well.  The patient denies any new symptoms such as chest discomfort, neck or arm discomfort. There has been no new shortness of breath, PND or orthopnea. There have been no reported palpitations, presyncope or syncope.   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    No Known Allergies  Current Outpatient Prescriptions  Medication Sig Dispense Refill  . amoxicillin (AMOXIL) 500 MG capsule 4 capsules one hour prior to dental procedure      . aspirin EC 325 MG EC tablet Take 1 tablet (325 mg total) by mouth daily.  30 tablet    . cholecalciferol (VITAMIN D) 400 UNITS TABS Take 400 Units by mouth daily.      Marland Kitchen desonide (DESOWEN) 0.05 % cream Apply 1 application topically 2 (two) times daily as needed. rash      . fluticasone (FLONASE) 50 MCG/ACT nasal spray Place 1 spray into the nose daily as needed. Nasal allergies      . Ibuprofen (ADVIL) 200 MG CAPS Take 4 capsules (800 mg total) by mouth as needed.      . loratadine (CLARITIN) 10 MG tablet Take 10 mg by mouth as needed for allergies.      . metoprolol tartrate (LOPRESSOR) 25 MG tablet Take 12.5 mg by mouth daily.      . multivitamin (THERAGRAN) per tablet Take 1 tablet by mouth daily.         No current facility-administered medications for this visit.    Past Medical History  Diagnosis Date  . Hypertension     x16 years  . Hyperlipidemia   . Bicuspid aortic valve   . Migraine   . Aortic stenosis 05/06/2012  . Hematuria   . S/P aortic valve replacement with bioprosthetic valve 06/02/2012    23mm [facility] Ease pericardial tissue valve via right mini thoracotomy    Past Surgical History  Procedure Laterality Date  . Hip arthroscopy      left hip  . Cardiac catheterization    . Closed manipulation shoulder    . Cystoscopy    . Aortic valve replacement  06/02/2012    Procedure: MINIMALLY INVASIVE AORTIC VALVE  REPLACEMENT (AVR);  Surgeon: Purcell Nails, MD;  Location: [facility] OR;  Service: Open Heart Surgery;  Laterality: N/A;  . Intraoperative transesophageal echocardiogram  06/02/2012    Procedure: INTRAOPERATIVE TRANSESOPHAGEAL ECHOCARDIOGRAM;  Surgeon: Purcell Nails, MD;  Location: [facility] OR;  Service: Open Heart Surgery;  Laterality: N/A;  . Exploration post operative open heart  06/02/2012    Procedure: EXPLORATION POST OPERATIVE OPEN HEART;  Surgeon: Purcell Nails, MD;  Location: [facility] OR;  Service: Open Heart Surgery;  Laterality: N/A;    ROS:  As stated in the HPI and negative for all other systems.  PHYSICAL EXAM BP 132/75  Pulse 53  Ht 5\' 9"  (1.753 m)  Wt 160 lb (72.576 kg)  BMI 23.62 kg/m2 GENERAL:  Well appearing NECK:  No jugular venous distention, waveform within normal limits, carotid upstroke mildly reduced and symmetric, no bruits, no thyromegaly LUNGS:  Clear to auscultation bilaterally BACK:  No CVA tenderness CHEST:  Well healed left thoracotomy scar.  HEART:  PMI not displaced or sustained,S1 and S2 within normal limits, no S3, no S4, no clicks, no rubs, slight apical systolic murmur early peaking with radiation to the carotids murmurs ABD:  Flat, positive bowel sounds  normal in frequency in pitch, no bruits, no rebound, no guarding, no midline pulsatile mass, no hepatomegaly, no splenomegaly EXT:  2 plus pulses throughout, no edema, no cyanosis no clubbing   EKG:  Sinus rhythm, rate 56, axis within normal limits, intervals within normal limits, no acute ST-T wave changes. 09/05/2012  ASSESSMENT AND PLAN  AORTIC VALVE DISEASE/AVR I will check an echocardiogram in December.  No change in therapy is indicated.   HYPERTENSION His blood pressure is controlled. He will continue the meds as listed.    HYPERLIPIDEMIA  He had no indication for statin as he has normal coronaries.

## 2012-09-05 NOTE — Patient Instructions (Addendum)
The current medical regimen is effective;  continue present plan and medications.  Your physician has requested that you have an echocardiogram in December 2014. Echocardiography is a painless test that uses sound waves to create images of your heart. It provides your doctor with information about the size and shape of your heart and how well your heart's chambers and valves are working. This procedure takes approximately one hour. There are no restrictions for this procedure.  Follow up in 1 year with Dr [name].  You will receive a letter in the mail 2 months before you are due.  Please call us when you receive this letter to schedule your follow up appointment.

## 2012-09-26 ENCOUNTER — Other Ambulatory Visit: Payer: Self-pay | Admitting: *Deleted

## 2012-09-26 ENCOUNTER — Encounter: Payer: Self-pay | Admitting: Cardiology

## 2012-09-26 MED ORDER — METOPROLOL TARTRATE 25 MG PO TABS: 12.5000 mg | ORAL_TABLET | Freq: Every day | ORAL | Status: DC

## 2012-10-03 ENCOUNTER — Ambulatory Visit (INDEPENDENT_AMBULATORY_CARE_PROVIDER_SITE_OTHER): Payer: BC Managed Care – PPO | Admitting: Thoracic Surgery (Cardiothoracic Vascular Surgery)

## 2012-10-03 ENCOUNTER — Encounter: Payer: Self-pay | Admitting: Thoracic Surgery (Cardiothoracic Vascular Surgery)

## 2012-10-03 VITALS — BP 153/85 | HR 50 | Resp 16 | Ht 70.0 in | Wt 159.0 lb

## 2012-10-03 DIAGNOSIS — Z953 Presence of xenogenic heart valve: Secondary | ICD-10-CM

## 2012-10-03 DIAGNOSIS — Z954 Presence of other heart-valve replacement: Secondary | ICD-10-CM

## 2012-10-03 DIAGNOSIS — I350 Nonrheumatic aortic (valve) stenosis: Secondary | ICD-10-CM

## 2012-10-03 DIAGNOSIS — Z952 Presence of prosthetic heart valve: Secondary | ICD-10-CM

## 2012-10-03 DIAGNOSIS — I359 Nonrheumatic aortic valve disorder, unspecified: Secondary | ICD-10-CM

## 2012-10-03 NOTE — Patient Instructions (Signed)

## 2012-10-03 NOTE — Progress Notes (Signed)
[address]            [name] [address]          [phone]     CARDIOTHORACIC SURGERY OFFICE NOTE  Referring Provider is [name], MD PCP is No primary provider on file.   HPI:  Patient returns for routine followup status post minimally invasive aortic valve replacement using a bioprosthetic tissue valve on 06/02/2012. He was last seen here in the office 2 months ago. Since then he has done remarkably well. He has completed the cardiac rehabilitation program and he notes that his exercise tolerance is now much better than it had been for several years. He is back enjoying completely normal and unrestricted physical activity with considerable improvement in his aerobic exercise tolerance.  He had a followup echocardiogram performed 07/18/2012 demonstrating normal left ventricular size and systolic function with normal functioning bioprosthetic tissue valve in the aortic position. Overall the patient is delighted with his progress and he has no complaints.   Current Outpatient Prescriptions  Medication Sig Dispense Refill  . amoxicillin (AMOXIL) 500 MG capsule 4 capsules one hour prior to dental procedure      . aspirin EC 325 MG EC tablet Take 1 tablet (325 mg total) by mouth daily.  30 tablet    . cholecalciferol (VITAMIN D) 400 UNITS TABS Take 400 Units by mouth daily.      Marland Kitchen desonide (DESOWEN) 0.05 % cream Apply 1 application topically 2 (two) times daily as needed. rash      . fluticasone (FLONASE) 50 MCG/ACT nasal spray Place 1 spray into the nose daily as needed. Nasal allergies      . Ibuprofen (ADVIL) 200 MG CAPS Take 4 capsules (800 mg total) by mouth as needed.      . loratadine (CLARITIN) 10 MG tablet Take 10 mg by mouth as needed for allergies.      . metoprolol tartrate (LOPRESSOR) 25 MG tablet Take 0.5 tablets (12.5 mg total) by mouth daily.  90 tablet  3  . multivitamin (THERAGRAN) per tablet Take 1 tablet by mouth daily.           No current facility-administered medications for this visit.      Physical Exam:   BP 153/85  Pulse 50  Resp 16  Ht 5\' 10"  (1.778 m)  Wt 159 lb (72.122 kg)  BMI 22.81 kg/m2  SpO2 99%  General:  Well-appearing  Chest:   Clear to auscultation  CV:   Regular rate and rhythm without murmur  Incisions:  Completely healed  Abdomen:  Soft nontender  Extremities:  Warm and well-perfused   Diagnostic Tests:  Transthoracic Echocardiography  Patient: John Boyd, John Boyd MR #: [id number] Study Date: 07/18/2012 Gender: M Age: 77 Height: 175.3cm Weight: 70.8kg BSA: 1.73m^2 Pt. Status: Room:  ATTENDING Nahser, Georgiann Hahn, Scott Marylen Ponto, Scott SONOGRAPHER Aida Raider, RDCS PERFORMING [facility], [facility] cc:  ------------------------------------------------------------ LV EF: 55% - 60%  ------------------------------------------------------------ Indications: 424.1 Aortic valve disorders.  ------------------------------------------------------------ History: PMH: Acquired from the patient and from the patient's chart. PMH: History of Bicuspid Aortic Valve. Risk factors: Hypertension. Dyslipidemia.  ------------------------------------------------------------ Study Conclusions  - Left ventricle: The cavity size was normal. Wall thickness was increased in a pattern of mild LVH. Systolic function was normal. The estimated ejection fraction was in the range of 55% to 60%. Wall motion was normal;  there were no regional wall motion abnormalities. - Aortic valve: A bioprosthesis was present. Mean gradient: 12mm Hg (S). Peak gradient: 18mm Hg (S).  ------------------------------------------------------------ Labs, prior tests, procedures, and surgery: Valve surgery (recent, June 02, 2012). Aortic valve replacement with a valve.  Transthoracic echocardiography. M-mode, complete 2D, spectral Doppler, and color Doppler. Height:  Height: 175.3cm. Height: 69in. Weight: Weight: 70.8kg. Weight: 155.7lb. Body mass index: BMI: 23kg/m^2. Body surface area: BSA: 1.29m^2. Blood pressure: 128/88. Patient status: Outpatient. Location: [facility]  ------------------------------------------------------------  ------------------------------------------------------------ Left ventricle: The cavity size was normal. Wall thickness was increased in a pattern of mild LVH. Systolic function was normal. The estimated ejection fraction was in the range of 55% to 60%. Wall motion was normal; there were no regional wall motion abnormalities.  ------------------------------------------------------------ Aortic valve: A bioprosthesis was present. Cusp separation was normal. Doppler: Transvalvular velocity was within the normal range. There was no stenosis. No regurgitation. VTI ratio of LVOT to aortic valve: 0.6. Peak velocity ratio of LVOT to aortic valve: 0.51. Mean gradient: 12mm Hg (S). Peak gradient: 18mm Hg (S).  ------------------------------------------------------------ Aorta: Aortic root: The aortic root was normal in size. Ascending aorta: The ascending aorta was normal in size.  ------------------------------------------------------------ Mitral valve: Structurally normal valve. Leaflet separation was normal. Doppler: Transvalvular velocity was within the normal range. There was no evidence for stenosis. No regurgitation. Peak gradient: 5mm Hg (D).  ------------------------------------------------------------ Left atrium: The atrium was normal in size.  ------------------------------------------------------------ Right ventricle: The cavity size was normal. Systolic function was normal.  ------------------------------------------------------------ Pulmonic valve: The valve appears to be grossly normal. Doppler: No significant  regurgitation.  ------------------------------------------------------------ Tricuspid valve: Structurally normal valve. Leaflet separation was normal. Doppler: Transvalvular velocity was within the normal range. Trivial regurgitation.  ------------------------------------------------------------ Pulmonary artery: Systolic pressure was within the normal range.  ------------------------------------------------------------ Right atrium: The atrium was normal in size.  ------------------------------------------------------------ Pericardium: There was no pericardial effusion.  ------------------------------------------------------------ Systemic veins: Inferior vena cava: The vessel was normal in size; the respirophasic diameter changes were in the normal range (= 50%); findings are consistent with normal central venous pressure.  ------------------------------------------------------------  2D measurements Normal Doppler measurements Normal Left ventricle Main pulmonary LVID ED, 36.7 mm 43-52 artery chord, Pressure, S 23 mm =30 PLAX Hg LVID ES, 23.6 mm 23-38 Left ventricle chord, Ea, lat ann, 12. cm/s ------ PLAX tiss DP 8 FS, chord, 36 % >29 E/Ea, lat 8.6 ------ PLAX ann, tiss DP 7 LVPW, ED 12.9 mm ------ Ea, med ann, 9.1 cm/s ------ IVS/LVPW 1.09 <1.3 tiss DP ratio, ED E/Ea, med 12. ------ Ventricular septum ann, tiss DP 2 IVS, ED 14.1 mm ------ LVOT Aorta Peak vel, S 109 cm/s ------ Root diam, 32 mm ------ VTI, S 25. cm ------ ED 8 Left atrium Aortic valve AP dim 35 mm ------ Peak vel, S 212 cm/s ------ AP dim 1.88 cm/m^2 <2.2 Mean vel, S 164 cm/s ------ index VTI, S 43. cm ------ 3 Mean 12 mm ------ gradient, S Hg Peak 18 mm ------ gradient, S Hg VTI ratio 0.6 ------ LVOT/AV Peak vel 0.5 ------ ratio, 1 LVOT/AV Mitral valve Peak E vel 111 cm/s ------ Peak A vel 50. cm/s ------ 5 Deceleration 292 ms 150-23 time 0 Peak 5 mm ------ gradient, D Hg Peak  E/A 2.2 ------ ratio Tricuspid valve Regurg peak 215 cm/s ------ vel Peak RV-RA 18 mm ------ gradient, S Hg Systemic veins Estimated CVP 5 mm ------ Hg Right ventricle Pressure, S 23 mm <30 Hg  Sa vel, lat 10. cm/s ------ ann, tiss DP 3  ------------------------------------------------------------ Prepared and Electronically Authenticated by  Kristeen Miss 2014-02-03T15:[id number]    Impression:  The patient is doing exceptionally well more than 3 months following minimally invasive aortic valve replacement using a bioprosthetic tissue valve.  Plan:  The patient will continue to followup with Dr. [name] indefinitely. In the future he will call and return to see [place] only should further problems or questions arise. All of his questions have been answered.    Salvatore Decent. Cornelius Moras, MD 10/03/2012 12:29 PM

## 2012-10-10 ENCOUNTER — Telehealth: Payer: Self-pay | Admitting: Cardiology

## 2012-10-10 NOTE — Telephone Encounter (Signed)
Per pt call - states his BP is staying elevated quite a bit and he is having migraines from it.  If BP is above 130 systolic he has h/a per his report.  BP has been 140-150/90's a lot lately.  He is only taking Metoprolol 12.5 mg a day.  In review his HR has been in the 50's.  He has in the past taken Lisinopril for his BP.  This was d/ced per his report and changed to Metoprolol to help control his HR in December during his hospitalization.  Pt is aware I will send this information to Dr [name] for review and contact him once I have orders.  Pt states understanding and is in agreement.

## 2012-10-10 NOTE — Telephone Encounter (Signed)
New Prob     Pt states he is having some issues with his BP and would like to speak to nurse regarding this.

## 2012-10-11 MED ORDER — LISINOPRIL 5 MG PO TABS: 5.0000 mg | ORAL_TABLET | Freq: Every day | ORAL | Status: DC

## 2012-10-11 NOTE — Telephone Encounter (Signed)
Patient can take Lisinopril 5 mg daily.

## 2012-10-11 NOTE — Telephone Encounter (Signed)
Pt is aware to start taken Lisinopril 5 mg daily, per Dr. [name] MD. A prescription send to [facility] in [place]. Pt aware.

## 2013-04-20 ENCOUNTER — Other Ambulatory Visit: Payer: Self-pay

## 2013-06-01 ENCOUNTER — Other Ambulatory Visit (HOSPITAL_COMMUNITY): Payer: BC Managed Care – PPO

## 2013-06-21 ENCOUNTER — Telehealth: Payer: Self-pay | Admitting: Cardiology

## 2013-06-21 NOTE — Telephone Encounter (Signed)
Pt aware to have 2 D Echo completed as ordered and scheduled for 2/9 at 9:30.

## 2013-06-21 NOTE — Telephone Encounter (Signed)
New message    Per Colin BentonCharmaine--echo was resc from 06-22-13 to 07-23-13 because ins would not pay until it was scheduled after 07-19-13.  Pt said Dr Antoine PocheHochrein wanted him to have an echo in jan 2015.  He had valve surgery 06-02-13.  Pt want to know if it is OK from Dr Antoine PocheHochrein to schedule echo in feb.

## 2013-06-22 ENCOUNTER — Other Ambulatory Visit (HOSPITAL_COMMUNITY): Payer: BC Managed Care – PPO

## 2013-07-01 ENCOUNTER — Other Ambulatory Visit: Payer: Self-pay | Admitting: Cardiology

## 2013-07-05 ENCOUNTER — Other Ambulatory Visit: Payer: Self-pay | Admitting: Cardiology

## 2013-07-24 ENCOUNTER — Other Ambulatory Visit (HOSPITAL_COMMUNITY): Payer: BC Managed Care – PPO

## 2013-07-28 ENCOUNTER — Telehealth: Payer: Self-pay | Admitting: *Deleted

## 2013-07-28 NOTE — Telephone Encounter (Signed)
Per Charmaine in precert dept - [employer] will not cover for the pt to have another echocardiogram until 2017 unless the pt is having problems.  If MD feels it is necessary a peer to peer can be done.   Called pt who is aware but voiced discontentment with this decision and asked how Dr Antoine PocheHochrein felt about this.  Advised Dr Antoine PocheHochrein not in the office this afternoon however I will let him know and call back with any other recommendations.

## 2013-07-31 ENCOUNTER — Other Ambulatory Visit (HOSPITAL_COMMUNITY): Payer: BC Managed Care – PPO

## 2013-07-31 NOTE — Telephone Encounter (Signed)
Tell him I will wait until March and see him in the office before ordering the next echo.

## 2013-08-02 NOTE — Telephone Encounter (Signed)
Pt aware and will keep his appt in March as scheduled.

## 2013-09-01 ENCOUNTER — Other Ambulatory Visit: Payer: Self-pay

## 2013-09-01 MED ORDER — LISINOPRIL 5 MG PO TABS: ORAL_TABLET | ORAL | Status: DC

## 2013-09-07 ENCOUNTER — Ambulatory Visit (INDEPENDENT_AMBULATORY_CARE_PROVIDER_SITE_OTHER): Payer: BC Managed Care – PPO | Admitting: Cardiology

## 2013-09-07 ENCOUNTER — Encounter: Payer: Self-pay | Admitting: Cardiology

## 2013-09-07 VITALS — BP 110/80 | HR 47 | Ht 70.0 in | Wt 163.0 lb

## 2013-09-07 DIAGNOSIS — I359 Nonrheumatic aortic valve disorder, unspecified: Secondary | ICD-10-CM

## 2013-09-07 DIAGNOSIS — I350 Nonrheumatic aortic (valve) stenosis: Secondary | ICD-10-CM

## 2013-09-07 NOTE — Patient Instructions (Signed)
The current medical regimen is effective;  continue present plan and medications.  Follow up as needed 

## 2013-09-07 NOTE — Progress Notes (Signed)
HPI The patient presents for followup of aortic stenosis s/p AVR.  Since I last saw him he has done well.  The patient denies any new symptoms such as chest discomfort, neck or arm discomfort. There has been no new shortness of breath, PND or orthopnea. There have been no reported palpitations, presyncope or syncope.   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   No Known Allergies  Current Outpatient Prescriptions  Medication Sig Dispense Refill  . amoxicillin (AMOXIL) 500 MG capsule 4 capsules one hour prior to dental procedure      . aspirin EC 325 MG EC tablet Take 1 tablet (325 mg total) by mouth daily.  30 tablet    . cholecalciferol (VITAMIN D) 400 UNITS TABS Take 400 Units by mouth daily.      Marland Kitchen desonide (DESOWEN) 0.05 % cream Apply 1 application topically 2 (two) times daily as needed. rash      . fluticasone (FLONASE) 50 MCG/ACT nasal spray Place 1 spray into the nose daily as needed. Nasal allergies      . Ibuprofen (ADVIL) 200 MG CAPS Take 4 capsules (800 mg total) by mouth as needed.      Marland Kitchen lisinopril (PRINIVIL,ZESTRIL) 5 MG tablet TAKE 1 TABLET (5 MG TOTAL) BY MOUTH DAILY.  30 tablet  3  . loratadine (CLARITIN) 10 MG tablet Take 10 mg by mouth as needed for allergies.      . metoprolol tartrate (LOPRESSOR) 25 MG tablet Take 0.5 tablets (12.5 mg total) by mouth daily.  90 tablet  3  . multivitamin (THERAGRAN) per tablet Take 1 tablet by mouth daily.         No current facility-administered medications for this visit.    Past Medical History  Diagnosis Date  . Hypertension     x16 years  . Hyperlipidemia   . Bicuspid aortic valve   . Migraine   . Aortic stenosis 05/06/2012  . Hematuria   . S/P aortic valve replacement with bioprosthetic valve 06/02/2012    23mm Edwards [facility] Ease pericardial tissue valve via right mini thoracotomy    Past Surgical History  Procedure Laterality Date  . Hip arthroscopy      left hip  . Cardiac catheterization     . Closed manipulation shoulder    . Cystoscopy    . Aortic valve replacement  06/02/2012    Procedure: MINIMALLY INVASIVE AORTIC VALVE REPLACEMENT (AVR);  Surgeon: Purcell Nails, MD;  Location: [facility] OR;  Service: Open Heart Surgery;  Laterality: N/A;  . Intraoperative transesophageal echocardiogram  06/02/2012    Procedure: INTRAOPERATIVE TRANSESOPHAGEAL ECHOCARDIOGRAM;  Surgeon: Purcell Nails, MD;  Location: [facility] OR;  Service: Open Heart Surgery;  Laterality: N/A;  . Exploration post operative open heart  06/02/2012    Procedure: EXPLORATION POST OPERATIVE OPEN HEART;  Surgeon: Purcell Nails, MD;  Location: [facility] OR;  Service: Open Heart Surgery;  Laterality: N/A;    ROS:  As stated in the HPI and negative for all other systems.  PHYSICAL EXAM There were no vitals taken for this visit. GENERAL:  Well appearing NECK:  No jugular venous distention, waveform within normal limits, carotid upstroke mildly reduced and symmetric, no bruits, no thyromegaly LUNGS:  Clear to auscultation bilaterally BACK:  No CVA tenderness CHEST:  Well healed left thoracotomy scar.  HEART:  PMI not displaced or sustained,S1 and S2 within normal limits, no S3, no S4, no clicks, no rubs, slight apical systolic murmur  early peaking with radiation to the carotids murmurs ABD:  Flat, positive bowel sounds normal in frequency in pitch, no bruits, no rebound, no guarding, no midline pulsatile mass, no hepatomegaly, no splenomegaly EXT:  2 plus pulses throughout, no edema, no cyanosis no clubbing   EKG:  Sinus rhythm, rate 47, axis within normal limits, intervals within normal limits, no acute ST-T wave changes. 09/07/2013  ASSESSMENT AND PLAN  AORTIC VALVE DISEASE/AVR He can have this followed clinically.  There was only very mild prosthetic stenosis previously.  I might consider repeat echo at the visit next year.    HYPERTENSION His blood pressure is controlled. He will continue the meds as listed.     HYPERLIPIDEMIA  He had no indication for statin as he has normal coronaries.

## 2014-01-04 ENCOUNTER — Telehealth: Payer: Self-pay | Admitting: Cardiology

## 2014-01-04 NOTE — Telephone Encounter (Signed)
Following up on his [employer] letter that was faxed on 12-29-13. Please fax back to them asap please.

## 2014-01-04 NOTE — Telephone Encounter (Signed)
i have the form and will get Dr. Antoine PocheHochrein to sign it on monday

## 2014-01-08 ENCOUNTER — Telehealth: Payer: Self-pay | Admitting: Cardiology

## 2014-01-08 NOTE — Telephone Encounter (Signed)
Please call John Boyd at Dr Roby LoftsScott's office,concerning John Boyd.

## 2014-01-09 NOTE — Telephone Encounter (Signed)
Information received at Dr. Carren RangSusan Scotts office

## 2014-05-31 IMAGING — CT CT HEART MORP W/ CTA COR W/ SCORE W/ CA W/CM &/OR W/O CM
1 series · 1 of 1 positions shown · non-contrast
Comparison: None

CONTRAST: 80mL OMNIPAQUE IOHEXOL 350 MG/ML SOLN

INDICATION: 61-year-old male with history of bicuspid aortic valve
with severe aortic stenosis.  Preprocedural evaluation prior to
aortic valve replacement.

CT ANGIOGRAPHY OF THE HEART, CORONARY ARTERY, STRUCTURE, AND
MORPHOLOGY
TECHNIQUE: CT angiography of the coronary vessels was performed on
a 256 channel system using prospective ECG gating.  A scout and ECG-
gated noncontrast exam (for calcium scoring) were performed.
Appropriate delay was determined by bolus tracking after injection
of iodinated contrast, and an ECG-gated coronary CTA was performed
with sub-mm slice collimation.  Imaging post processing was
performed on an independent workstation creating multiplanar and 3-
D images, allowing for quantitative analysis of the heart and
coronary arteries.  Note that this exam targets the heart and the
chest was not imaged in its entirety.
PREMEDICATION:
Lopressor  50 mg, P.O.

[Series 201: cac score · 1 of 1 slices shown]
[im 1/1]
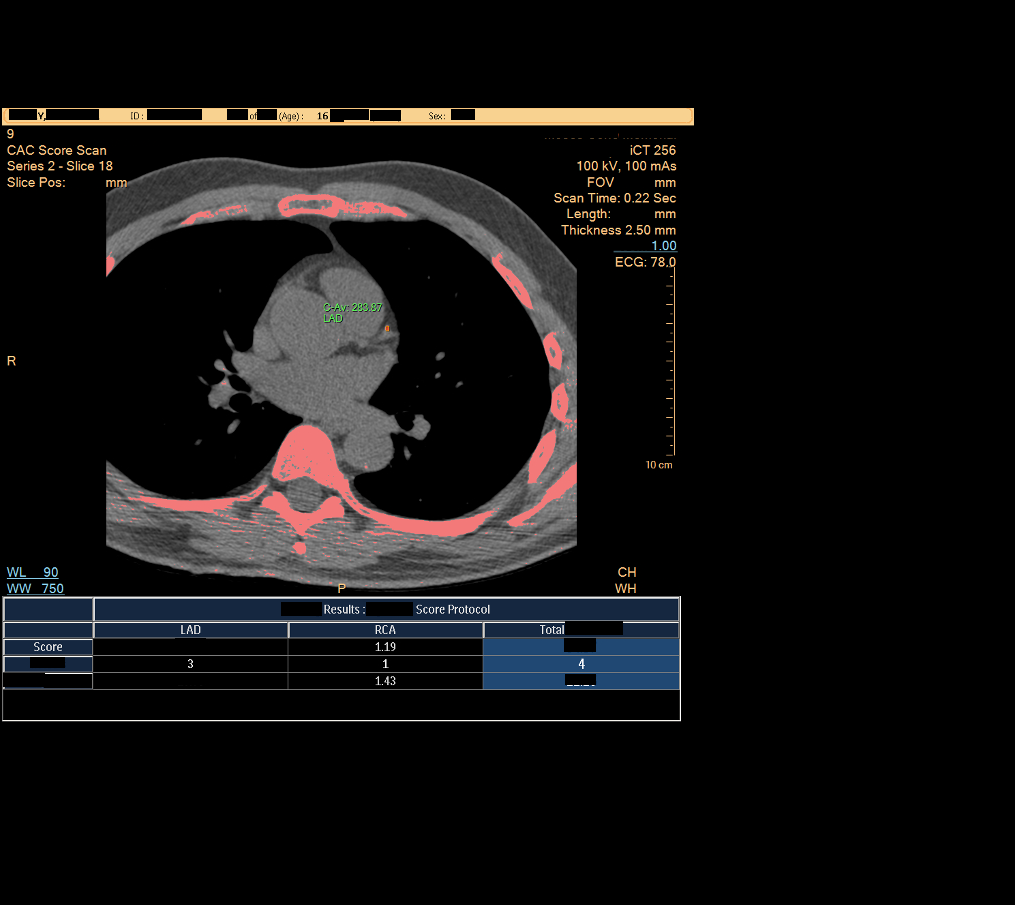

[1 of 1 positions shown; findings below may reference images not displayed]

FINDINGS: Technical quality: Excellent.
Heart rate:  52.

CORONARY ARTERIES:
Left Main:              Negative.
LAD:              Minimal calcified atherosclerotic plaque
proximally with no associated stenosis.
Diagonals:              Large-caliber D1, small D2 and D3.
Negative.
Ramus int:              Negative.
Jose Ignacio:               Negative.
OMs:              Large OM1.  Negative.
RCA:              Minimal calcified atherosclerotic proximally with
no associated stenosis.
PDA:                    Negative.
Dominance:        Right.

CORONARY CALCIUM:
Total Agatston Score:         35
[HOSPITAL] percentile:     50th

AORTIC ROOT:

Aortic Valve Description:  Functionally bicuspid aortic valve with
a median raphe between fused left and right cusps.  Independent
noncoronary cusp.  Severe thickening and calcification of the valve
with limited aortic valve area during systole (estimated at
cm2 by planimetry).  Notably, there appears to be some calcium
which impinges upon the orifice during various portions of systole,
and this calcified area was excluded in the planimetry measurement
which likely overestimates the severity of stenosis as this calcium
appeared to be relatively mobile.

Aortic Valve Area: 0.65 cm-sq

Aortic Annulus (systolic measurents):

      Long-axis:  29.3 mm

      Short-axis:  22.4 mm

      Cross-sectional area:  4.96 cm-sq

      Circumference:  78.1 mm

Sinuses of Valsalva (diastolic measurements):

      L-SOV -     Width:  31.3 mm

      R-SOV -     Width:  30.0 mm

      [HOSPITAL]-SOV -    Width:  33.0 mm

ADDITIONAL AORTA AND PULMONARY MEASUREMENTS:

Aortic root (21 - 40 mm):     26.8 mm at the sinotubular junction

Ascending aorta: (< 40 mm):         36 mm

Descending aorta:  (< 40 mm):       22 mm

Main pulmonary artery: (< 30 mm):   18 mm

OTHER FINDINGS:
Calcified granuloma in the right lower lobe larger more suspicious
appearing pulmonary nodules or masses within the visualized thorax.
No acute consolidative airspace disease or pleural effusions.
Visualized portions of the upper abdomen are unremarkable There are
no aggressive appearing lytic or blastic lesions noted in the
visualized portions of the skeleton.
IMPRESSION: 1.  Severely sclerotic functionally bicuspid aortic valve (fusion
of left and right cusps) severe aortic stenosis as detailed above.
Aortic root measurements, as above. Notably, there is no associated
thoracic aortic aneurysm at this time.
2.  Mild nonobstructive coronary artery disease, as above.  The
patient's total coronary artery calcium score is 35 which is 50th
percentile for patient's of matched age, gender and race/ethnicity.
Assessment for potential risk factor modification, dietary therapy
or pharmacologic therapy may be warranted, if clinically indicated.
3.  Right coronary artery dominance.

## 2014-06-08 IMAGING — CR DG CHEST 2V
2 series · 2 of 2 positions shown · non-contrast
Comparison: 05/23/2012

CLINICAL DATA: Precardiac surgery.

CHEST - 2 VIEW

[view not recorded (1 of 2)]
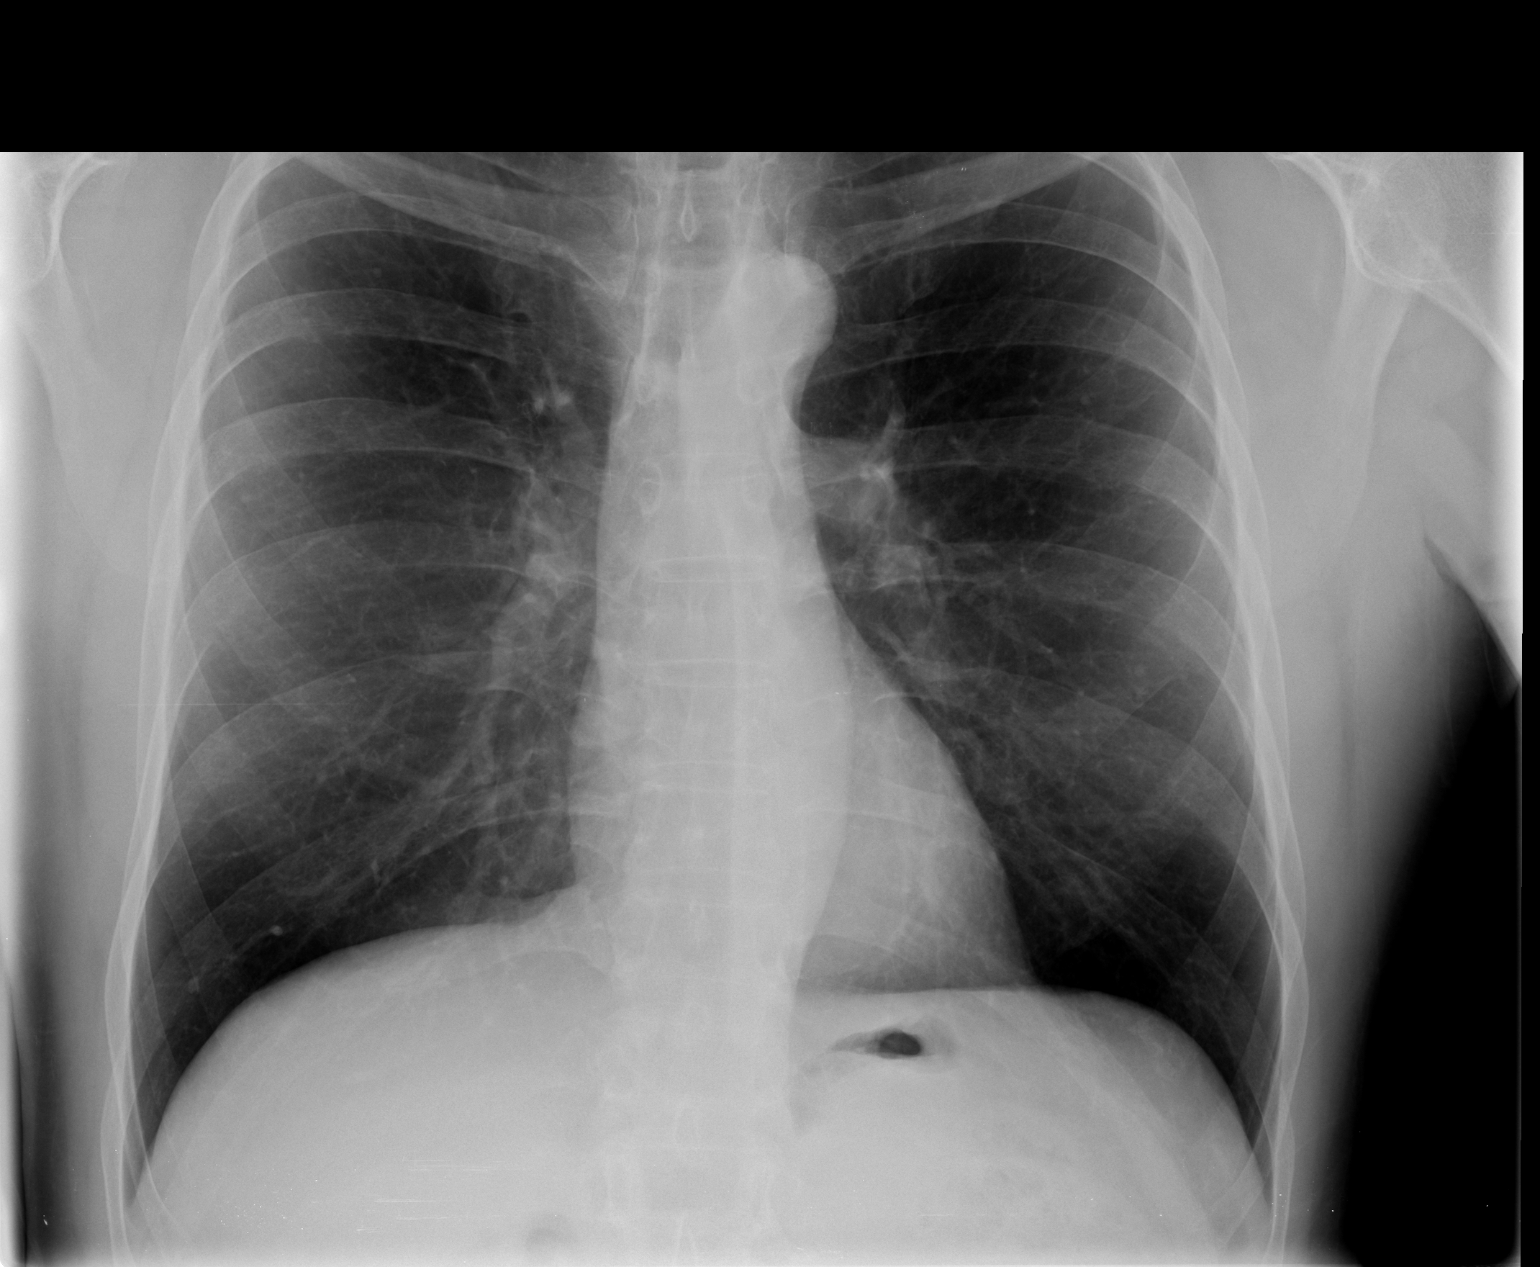

[view not recorded (2 of 2)]
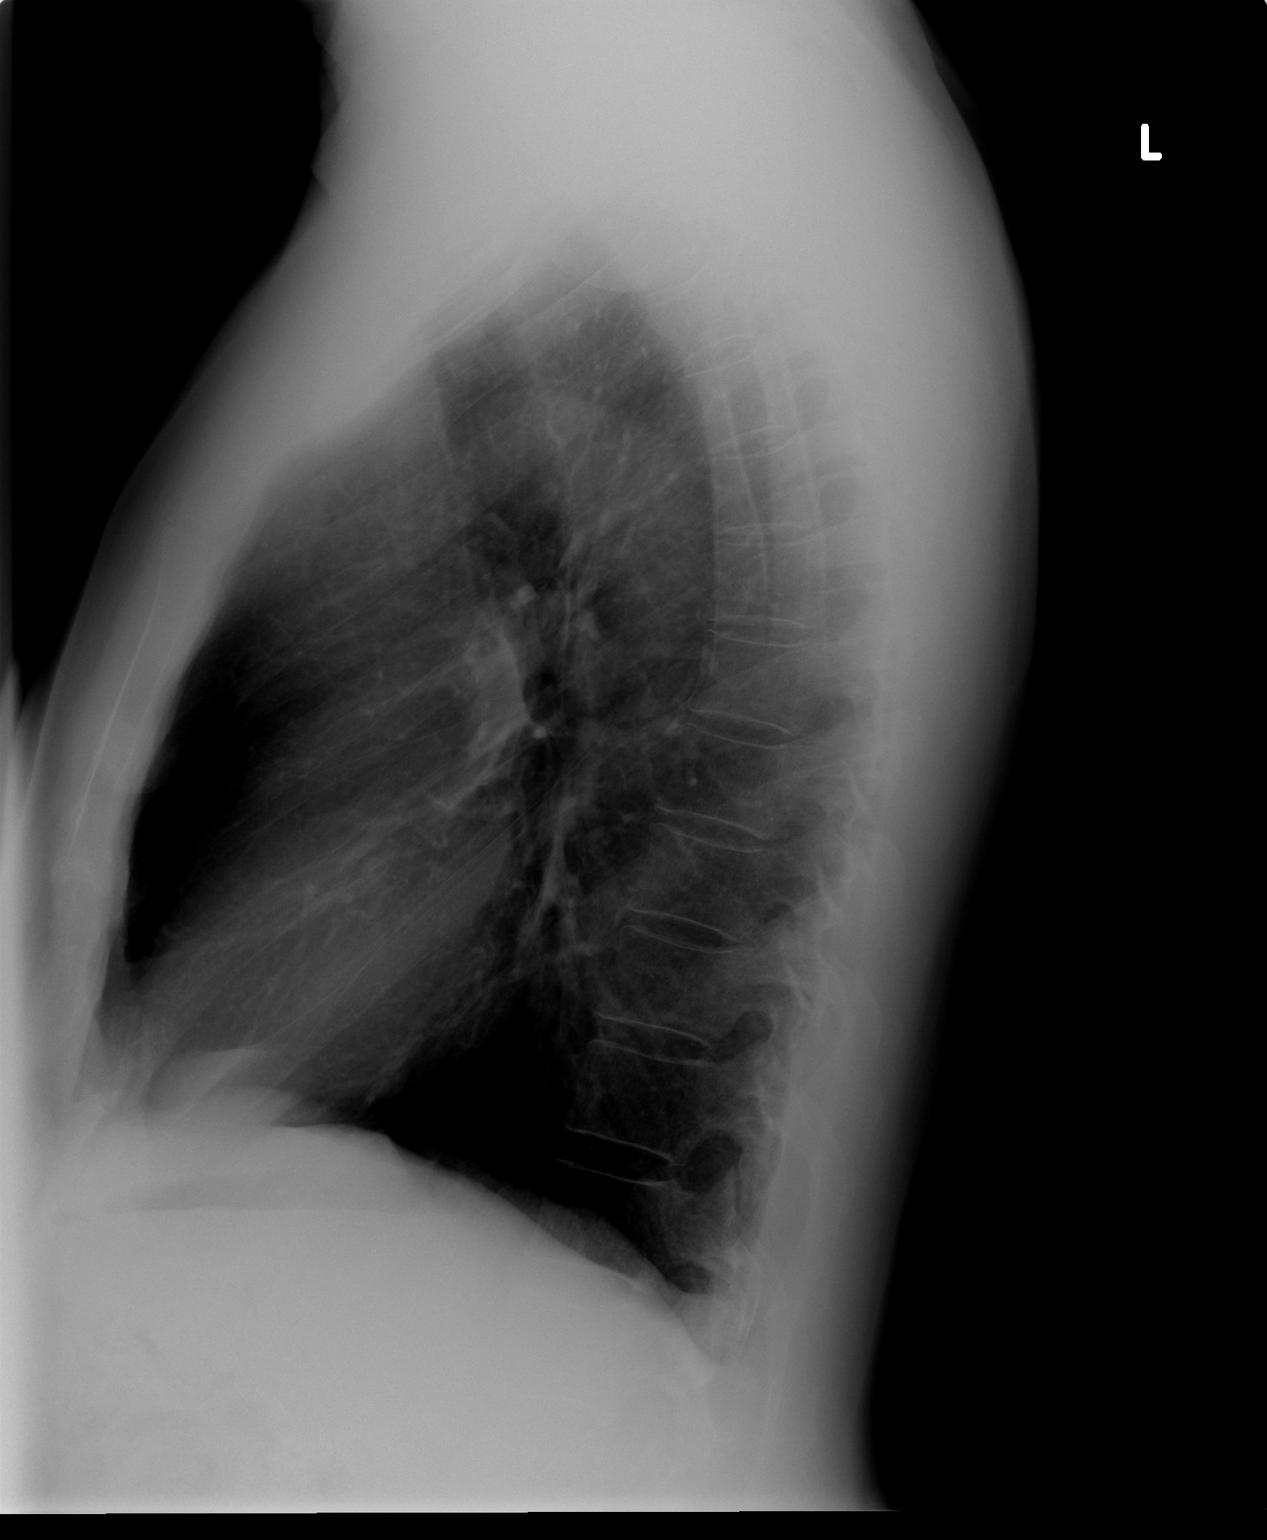

[2 of 2 positions shown; findings below may reference images not displayed]

FINDINGS: The heart size and mediastinal contours are within normal
limits.  Both lungs are clear.  The visualized skeletal structures
are unremarkable.
IMPRESSION: Negative exam.

## 2014-06-10 IMAGING — CR DG CHEST 1V PORT
1 series · 1 of 1 positions shown · non-contrast
Comparison: 05/31/2012

CLINICAL DATA: Postop mitral valve replacement

PORTABLE CHEST - 1 VIEW

[AP]
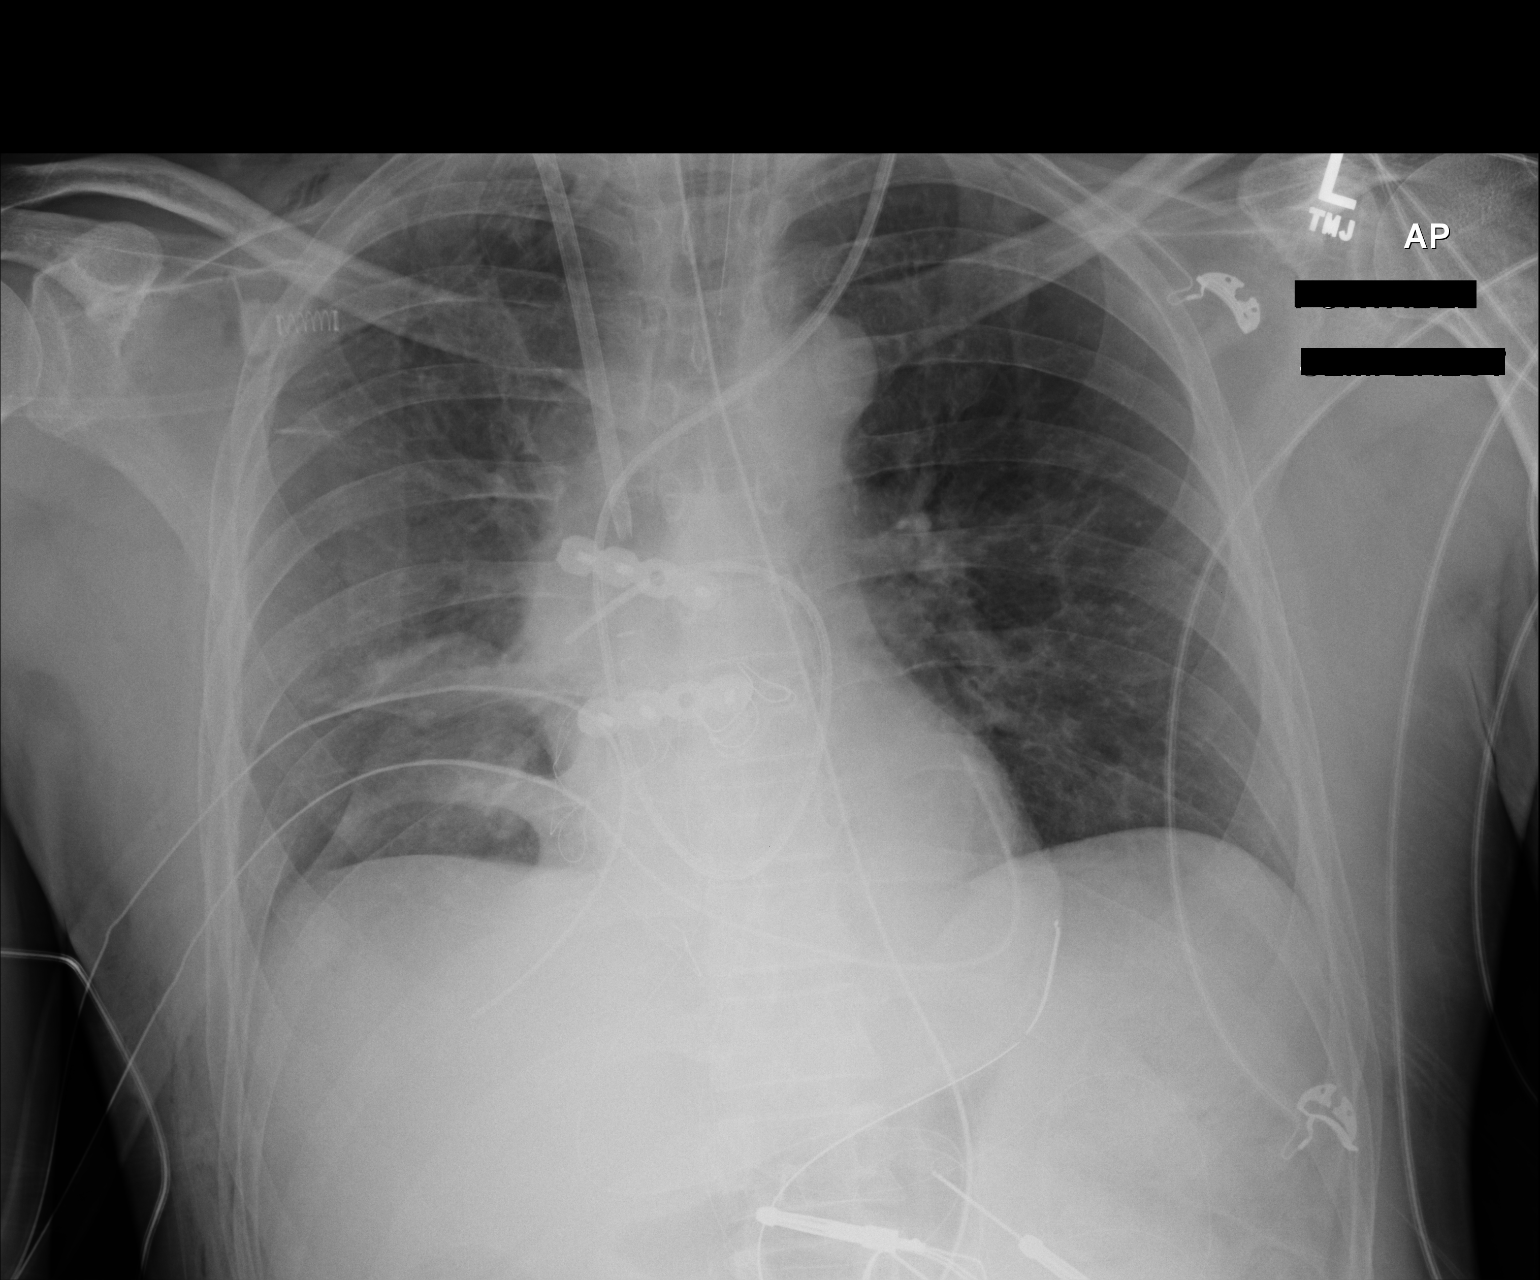

[1 of 1 positions shown; findings below may reference images not displayed]

FINDINGS: Endotracheal tube in good position.  Swan-Ganz catheter
tip in the right lower pulmonary artery.  Right jugular catheter
tip in the SVC.

Pericardial and mediastinal drains are noted.  Right basilar chest
tube.  No pneumothorax.  There is  right lower lobe atelectasis and
a small right effusion.  No heart failure.
IMPRESSION: Postop changes.  Support lines in good position.  No pneumothorax.

## 2014-12-25 ENCOUNTER — Telehealth: Payer: Self-pay | Admitting: Cardiology

## 2014-12-25 NOTE — Telephone Encounter (Signed)
SPOKE TO PATIENT  NEEDS A LETTER /OR NOTE SENT TO [facility] FOR  D.O.T. PHYSICAL  ( LICENSE IS UNTIL Feb 09 2015) FAX [phone]  PATIENT'S LAST VISIT WAS 09/07/2013. INSTRUCTION WAS FOLLOW UP AS NEEDED. RN INFORMED PATIENT NOT SURE IF APPOINTMENT IS NEEDED.PATIENT STATES HE IS NOT HAVING ANY SYMPTOMS AND FEEING GOOD.  RN INFORMED PATIENT  SOMEONE WILL CONTACT HIM ONCE ANSWER/LETTER IS READY.

## 2014-12-25 NOTE — Telephone Encounter (Signed)
The patient can drive with no restrictions.  Please send such a letter.

## 2014-12-25 NOTE — Telephone Encounter (Signed)
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

## 2014-12-26 NOTE — Telephone Encounter (Signed)
Routed TELEPHONE message information to [facility] Patient aware.

## 2015-01-24 ENCOUNTER — Telehealth: Payer: Self-pay | Admitting: Internal Medicine

## 2015-01-24 NOTE — Telephone Encounter (Signed)
Patients wife aware last tetanus was 2002 and we do not have a record of any tdap given

## 2015-04-08 ENCOUNTER — Telehealth: Payer: Self-pay | Admitting: Internal Medicine

## 2015-04-09 NOTE — Telephone Encounter (Signed)
Spoke to patient's wife-aware

## 2015-10-18 ENCOUNTER — Ambulatory Visit: Payer: Self-pay | Admitting: Cardiology

## 2015-11-13 ENCOUNTER — Encounter: Payer: Self-pay | Admitting: Cardiology

## 2015-11-17 NOTE — Progress Notes (Signed)
HPI The patient presents for followup of aortic stenosis s/p AVR.  Since I last saw him he has done well.  The patient denies any new symptoms such as chest discomfort, neck or arm discomfort. There has been no new shortness of breath, PND or orthopnea. There have been no reported palpitations, presyncope or syncope.   He is exercising routinely without limitations.    No Known Allergies  Current Outpatient Prescriptions  Medication Sig Dispense Refill  . amoxicillin (AMOXIL) 500 MG capsule 4 capsules one hour prior to dental procedure    . aspirin EC 325 MG EC tablet Take 1 tablet (325 mg total) by mouth daily. 30 tablet   . colchicine 0.6 MG tablet Take 1 tablet by mouth as needed.    . fluticasone (FLONASE) 50 MCG/ACT nasal spray Place 1 spray into the nose daily as needed. Nasal allergies    . Ibuprofen (ADVIL) 200 MG CAPS Take 4 capsules (800 mg total) by mouth as needed.    Marland Kitchen lisinopril (PRINIVIL,ZESTRIL) 5 MG tablet TAKE 1 TABLET (5 MG TOTAL) BY MOUTH DAILY. 30 tablet 3  . loratadine (CLARITIN) 10 MG tablet Take 10 mg by mouth as needed for allergies.     No current facility-administered medications for this visit.    Past Medical History  Diagnosis Date  . Hypertension     x16 years  . Hyperlipidemia   . Bicuspid aortic valve   . Migraine   . Aortic stenosis 05/06/2012  . Hematuria   . S/P aortic valve replacement with bioprosthetic valve 06/02/2012    23mm [facility] Ease pericardial tissue valve via right mini thoracotomy    Past Surgical History  Procedure Laterality Date  . Hip arthroscopy      left hip  . Cardiac catheterization    . Closed manipulation shoulder    . Cystoscopy    . Aortic valve replacement  06/02/2012    Procedure: MINIMALLY INVASIVE AORTIC VALVE REPLACEMENT (AVR);  Surgeon: Purcell Nails, MD;  Location: [facility] OR;  Service: Open Heart Surgery;  Laterality: N/A;  . Intraoperative transesophageal echocardiogram  06/02/2012    Procedure:  INTRAOPERATIVE TRANSESOPHAGEAL ECHOCARDIOGRAM;  Surgeon: Purcell Nails, MD;  Location: [facility] OR;  Service: Open Heart Surgery;  Laterality: N/A;  . Exploration post operative open heart  06/02/2012    Procedure: EXPLORATION POST OPERATIVE OPEN HEART;  Surgeon: Purcell Nails, MD;  Location: [facility] OR;  Service: Open Heart Surgery;  Laterality: N/A;    ROS:  As stated in the HPI and negative for all other systems.  PHYSICAL EXAM BP 129/85 mmHg  Pulse 66  Ht  (1.778 m)  Wt 171 lb 9.6 oz (77.837 kg)  BMI 24.62 kg/m2  GENERAL:  Well appearing NECK:  No jugular venous distention, waveform within normal limits, carotid upstroke mildly reduced and symmetric, no bruits, no thyromegaly LUNGS:  Clear to auscultation bilaterally BACK:  No CVA tenderness CHEST:  Well healed left thoracotomy scar.  HEART:  PMI not displaced or sustained,S1 and S2 within normal limits, no S3, no S4, no clicks, no rubs, slight apical systolic murmur early peaking with radiation to the carotids murmurs ABD:  Flat, positive bowel sounds normal in frequency in pitch, no bruits, no rebound, no guarding, no midline pulsatile mass, no hepatomegaly, no splenomegaly EXT:  2 plus pulses throughout, no edema, no cyanosis no clubbing   EKG:  Sinus rhythm, rate 66, axis within normal limits, intervals within normal limits, no acute  ST-T wave changes. 11/18/2015  ASSESSMENT AND PLAN  AORTIC VALVE DISEASE/AVR I will follow up with an echo.  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  No change in therapy is indicated at this point.    HYPERTENSION His blood pressure is controlled. He will continue the meds as listed.    HYPERLIPIDEMIA  He had no indication for statin as he has normal coronaries.

## 2015-11-18 ENCOUNTER — Encounter: Payer: Self-pay | Admitting: Cardiology

## 2015-11-18 ENCOUNTER — Ambulatory Visit (INDEPENDENT_AMBULATORY_CARE_PROVIDER_SITE_OTHER): Payer: No Typology Code available for payment source | Admitting: Cardiology

## 2015-11-18 VITALS — BP 129/85 | HR 66 | Ht 70.0 in | Wt 171.6 lb

## 2015-11-18 DIAGNOSIS — Z954 Presence of other heart-valve replacement: Secondary | ICD-10-CM

## 2015-11-18 DIAGNOSIS — Z953 Presence of xenogenic heart valve: Secondary | ICD-10-CM

## 2015-11-18 NOTE — Patient Instructions (Signed)
Medication Instructions:  Continue current medications  Labwork: NONE  Testing/Procedures: Your physician has requested that you have an echocardiogram. Echocardiography is a painless test that uses sound waves to create images of your heart. It provides your doctor with information about the size and shape of your heart and how well your heart's chambers and valves are working. This procedure takes approximately one hour. There are no restrictions for this procedure.   Follow-Up: Your physician wants you to follow-up in: 18 Months. You will receive a reminder letter in the mail two months in advance. If you don't receive a letter, please call our office to schedule the follow-up appointment.   Any Other Special Instructions Will Be Listed Below (If Applicable).   If you need a refill on your cardiac medications before your next appointment, please call your pharmacy.   

## 2015-12-05 ENCOUNTER — Ambulatory Visit (HOSPITAL_COMMUNITY): Payer: No Typology Code available for payment source | Attending: Cardiology

## 2015-12-05 ENCOUNTER — Telehealth: Payer: Self-pay | Admitting: Cardiology

## 2015-12-05 ENCOUNTER — Other Ambulatory Visit: Payer: Self-pay

## 2015-12-05 DIAGNOSIS — I119 Hypertensive heart disease without heart failure: Secondary | ICD-10-CM | POA: Insufficient documentation

## 2015-12-05 DIAGNOSIS — I351 Nonrheumatic aortic (valve) insufficiency: Secondary | ICD-10-CM | POA: Diagnosis not present

## 2015-12-05 DIAGNOSIS — E785 Hyperlipidemia, unspecified: Secondary | ICD-10-CM | POA: Insufficient documentation

## 2015-12-05 DIAGNOSIS — Z953 Presence of xenogenic heart valve: Secondary | ICD-10-CM

## 2015-12-05 DIAGNOSIS — Z954 Presence of other heart-valve replacement: Secondary | ICD-10-CM

## 2015-12-05 DIAGNOSIS — I340 Nonrheumatic mitral (valve) insufficiency: Secondary | ICD-10-CM | POA: Insufficient documentation

## 2015-12-05 LAB — ECHOCARDIOGRAM COMPLETE
AV Peak grad: 22 mmHg
AV VEL mean LVOT/AV: 0.46
AV pk vel: 234 cm/s
AVG: 9 mmHg
Ao pk vel: 0.38 m/s
CHL CUP MV DEC (S): 222
DOP CAL AO MEAN VELOCITY: 138 cm/s
E decel time: 222 msec
E/e' ratio: 17.05
FS: 45 % — AB (ref 28–44)
IVS/LV PW RATIO, ED: 0.81
LA diam index: 1.58 cm/m2
LA vol A4C: 22 ml
LASIZE: 31 mm
LEFT ATRIUM END SYS DIAM: 31 mm
LV e' LATERAL: 4.17 cm/s
LVEEAVG: 17.05
LVEEMED: 17.05
LVOT VTI: 22.2 cm
LVOT peak grad rest: 3 mmHg
LVOTPV: 88.7 cm/s
LVOTVTI: 0.46 cm
MV Peak grad: 2 mmHg
MV pk A vel: 58.2 m/s
MVPKEVEL: 71.1 m/s
PW: 13.3 mm — AB (ref 0.6–1.1)
TDI e' lateral: 4.17
TDI e' medial: 8.44
VTI: 48.1 cm

## 2015-12-05 NOTE — Telephone Encounter (Signed)
Pt schedule to have Echo today, please let me know if pt is ok to drive, then I can write a letter to DOT.

## 2015-12-05 NOTE — Telephone Encounter (Signed)
New:  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 Antoine PocheHochrein.  Please fax this to [facility].  [phone]  Pt. Numbers are [phone] or cell [phone]

## 2015-12-05 NOTE — Telephone Encounter (Signed)
OK to drive.

## 2015-12-09 NOTE — Telephone Encounter (Signed)
[facility] fax [facility]..Marland Kitchen

## 2016-11-03 ENCOUNTER — Telehealth: Payer: Self-pay | Admitting: Cardiology

## 2016-11-03 NOTE — Telephone Encounter (Signed)
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 Please fax to [facility]-[phone],

## 2016-11-03 NOTE — Telephone Encounter (Signed)
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 He wants to know if he needs to be seen first. His last appointment was last June with a follow up for 18 months from that date.

## 2016-11-06 NOTE — Telephone Encounter (Signed)
I don't think that he needs to see me unless DOT requests something new.

## 2016-11-10 NOTE — Telephone Encounter (Signed)
Spoke with Mr John Boyd, pt stated he need letter clearing him to drive and [facility], letter and last Echo faxed via [employer].

## 2016-11-10 NOTE — Telephone Encounter (Signed)
Left message to call back, per DPR. 

## 2017-06-29 ENCOUNTER — Telehealth: Payer: Self-pay | Admitting: Cardiology

## 2017-06-29 NOTE — Telephone Encounter (Signed)
Forward to PRE OP POOL

## 2017-06-29 NOTE — Telephone Encounter (Signed)
New message  1. What dental office are you calling from? Enrique SackKendra calling from Dr. Clifton Jamesensie Perrotta  2. What is your office phone and fax number? [phone] /  fax [phone]   3. What type of procedure is the patient having performed? Full set of xray,  oral eval   4. What date is procedure scheduled or is the patient there now? 1.31.2019 @ 11:45am    5. What is your question (ex. Antibiotics prior to procedure, holding medication-we need to know how long dentist wants pt to hold med)? Does patient need to be pre-med prior to dental appt

## 2017-06-30 ENCOUNTER — Other Ambulatory Visit: Payer: Self-pay | Admitting: *Deleted

## 2017-06-30 MED ORDER — AMOXICILLIN 500 MG PO CAPS: 2000.0000 mg | ORAL_CAPSULE | Freq: Once | ORAL | 0 refills | Status: AC

## 2017-06-30 NOTE — Telephone Encounter (Signed)
lvm for pt that [place] [facility] [place] (500 mg ) take 4 tablets one hour prior to dental procedure.  Also contacted Enrique SackKendra from Dr. Kathi DerPerrotta office and stated lvm for pt about directions for antibiotics.

## 2017-06-30 NOTE — Telephone Encounter (Signed)
   Primary Cardiologist: No primary care provider on file.  Chart reviewed as part of pre-operative protocol coverage.   Please give prescription for Amoxicillin 2 grams orally one hour before procedure.  Please call with questions.  Roe RutherfordAngela Nicole Karalina Tift, PA 06/30/2017, 1:16 PM

## 2017-07-19 ENCOUNTER — Telehealth: Payer: Self-pay | Admitting: Cardiology

## 2017-07-19 NOTE — Telephone Encounter (Signed)
New message     New message  1. What dental office are you calling from? Dr. Wadie Lessenensie Perrotta  2. What is your office phone and fax number? [phone] /  fax [phone]   3. What type of procedure is the patient having performed? filing  4. What date is procedure scheduled or is the patient there now? 07/20/17  5. What is your question (ex. Antibiotics prior to procedure, holding medication-we need to know how long dentist wants pt to hold med)? Pre antobiotics

## 2017-07-19 NOTE — Telephone Encounter (Signed)
Follow up    Dr Kathi DerPerrotta [phone] office following up on clearance. Please call

## 2017-07-20 ENCOUNTER — Other Ambulatory Visit: Payer: Self-pay | Admitting: Cardiology

## 2017-07-20 ENCOUNTER — Other Ambulatory Visit: Payer: Self-pay | Admitting: *Deleted

## 2017-07-20 ENCOUNTER — Other Ambulatory Visit: Payer: Self-pay | Admitting: Physician Assistant

## 2017-07-20 MED ORDER — AMOXICILLIN 500 MG PO CAPS: ORAL_CAPSULE | ORAL | 0 refills | Status: DC

## 2017-07-20 MED ORDER — AMOXICILLIN 500 MG PO CAPS: ORAL_CAPSULE | ORAL | 1 refills | Status: DC

## 2017-07-20 NOTE — Telephone Encounter (Signed)
This is Dr. Hochrein's pt. °

## 2017-07-20 NOTE — Telephone Encounter (Signed)
Received call Dr Kathi DerPerrotta office stated that rx for Amoxicillin was not send into pt pharmacy and pt is having procedure today.  Rx has been sent to the pharmacy electronically. To pt [facility].

## 2017-07-20 NOTE — Telephone Encounter (Signed)
   Primary Cardiologist: Rollene RotundaJames Hochrein, MD  Chart reviewed as part of pre-operative protocol coverage. The patient has a history of bioprosthetic aortic valve.  According to the latest recommendations in Up-To-Date SBE Prophylaxis is suggested for dental work in settings associated with the highest risk of an adverse outcome if IE occurs. This includes patients with Prosthetic heart valves, including mechanical, bioprosthetic, and homograft valves (transcatheter-implanted as well as surgically implanted valves are included). Therefore, this patient will need to take antibiotics 30 minutes-1 hour prior to dental procedures.   The patient's dental procedure was actually done today and he did take his antibiotic as recommended.   Apparently the pt had some trouble getting his prescription sent in so I will send in a prescription for future use with one refill for Amoxicillin 500 mg -take 4 tablets 1 hour prior to dental work.   I will route this recommendation to the requesting party via Epic fax function and remove from pre-op pool.  Please call with questions.  Berton BonJanine Marshae Azam, NP 07/20/2017, 3:45 PM

## 2017-09-23 ENCOUNTER — Telehealth: Payer: Self-pay | Admitting: Cardiology

## 2017-09-23 NOTE — Telephone Encounter (Signed)
Leave message for pt to call back 

## 2017-09-23 NOTE — Telephone Encounter (Signed)
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

## 2017-09-23 NOTE — Telephone Encounter (Signed)
appt made with Dr Antoine PocheHochrein June 3rd @9 :00 am, pt will call back to schedule with PA id needed sooner appt

## 2017-09-23 NOTE — Telephone Encounter (Signed)
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  I could not send new information as I have not seen him since June 2017.

## 2017-09-23 NOTE — Telephone Encounter (Signed)
Routed to MD to review/advise on letter.

## 2017-10-26 ENCOUNTER — Encounter: Payer: Self-pay | Admitting: Cardiology

## 2017-11-14 NOTE — Progress Notes (Signed)
Cardiology Office Note   Date:  11/15/2017   ID:  John Boyd, DOB Jan 05, 1951, MRN [id number]  PCP:  Elissa Hefty, MD  Cardiologist:   [name], MD  Chief Complaint  Patient presents with  . AVR      History of Present Illness: John Boyd is a 67 y.o. male who presents for followup of aortic stenosis s/p AVR.    I saw him in 2017 and the AVR was functioning well on follow up echo.  Since I last saw him he has done well.  The patient denies any new symptoms such as chest discomfort, neck or arm discomfort. There has been no new shortness of breath, PND or orthopnea. There have been no reported palpitations, presyncope or syncope.  he has not been exercising for about 3 months secondary to foot pain and some injury falling off a ladder.  Prior to this he was getting his heart rate up into the 140s on a treadmill and having no complaints.     Past Medical History:  Diagnosis Date  . Aortic stenosis 05/06/2012  . Bicuspid aortic valve   . Hematuria   . Hyperlipidemia   . Hypertension    x16 years  . Migraine   . S/P aortic valve replacement with bioprosthetic valve 06/02/2012   23mm [facility] Ease pericardial tissue valve via right mini thoracotomy    Past Surgical History:  Procedure Laterality Date  . AORTIC VALVE REPLACEMENT  06/02/2012   Procedure: MINIMALLY INVASIVE AORTIC VALVE REPLACEMENT (AVR);  Surgeon: Purcell Nails, MD;  [facility];  Service: Open Heart Surgery;  Laterality: N/A;  . CARDIAC CATHETERIZATION    . CLOSED MANIPULATION SHOULDER    . CYSTOSCOPY    . EXPLORATION POST OPERATIVE OPEN HEART  06/02/2012   Procedure: EXPLORATION POST OPERATIVE OPEN HEART;  Surgeon: Purcell Nails, MD;  Location: [facility] OR;  Service: Open Heart Surgery;  Laterality: N/A;  . HIP ARTHROSCOPY     left hip  . INTRAOPERATIVE TRANSESOPHAGEAL ECHOCARDIOGRAM  06/02/2012   Procedure: INTRAOPERATIVE TRANSESOPHAGEAL ECHOCARDIOGRAM;  Surgeon: Purcell Nails, MD;  Location: [facility] OR;  Service: Open Heart Surgery;  Laterality: N/A;     Current Outpatient Medications  Medication Sig Dispense Refill  . amoxicillin (AMOXIL) 500 MG capsule Take 4 tablet 1 hour before procedure. For dental work only 4 capsule 1  . aspirin EC 81 MG tablet Take 81 mg by mouth daily.    . colchicine 0.6 MG tablet Take 1 tablet by mouth as needed.    . fluticasone (FLONASE) 50 MCG/ACT nasal spray Place 1 spray into the nose daily as needed. Nasal allergies    . Ibuprofen (ADVIL) 200 MG CAPS Take 4 capsules (800 mg total) by mouth as needed.    Marland Kitchen lisinopril (PRINIVIL,ZESTRIL) 5 MG tablet TAKE 1 TABLET (5 MG TOTAL) BY MOUTH DAILY. 30 tablet 3  . loratadine (CLARITIN) 10 MG tablet Take 10 mg by mouth as needed for allergies.     No current facility-administered medications for this visit.     Allergies:   Patient has no known allergies.    ROS:  Please see the history of present illness.   Otherwise, review of systems are positive for resting tremor.   All other systems are reviewed and negative.    PHYSICAL EXAM: VS:  BP 132/85   Pulse (!) 55   Ht 5\' 9"  (1.753 m)   Wt 172 lb (78 kg)  BMI 25.40 kg/m  , BMI Body mass index is 25.4 kg/m. GENERAL:  Well appearing NECK:  No jugular venous distention, waveform within normal limits, carotid upstroke brisk and symmetric, no bruits, no thyromegaly LUNGS:  Clear to auscultation bilaterally BACK:  No CVA tenderness CHEST:  Well healed sternotomy scar. HEART:  PMI not displaced or sustained,S1 and S2 within normal limits, no S3, no S4, no clicks, no rubs, no murmurs ABD:  Flat, positive bowel sounds normal in frequency in pitch, no bruits, no rebound, no guarding, no midline pulsatile mass, no hepatomegaly, no splenomegaly EXT:  2 plus pulses throughout, no edema, no cyanosis no clubbing NEURO:  Left arm tremor.      EKG:  EKG is ordered today. The ekg ordered today demonstrates normal sinus rhythm, rate 55, axis  within normal limits, intervals within normal limits, no acute ST-T wave changes.   Recent Labs: No results found for requested labs within last 8760 hours.    Lipid Panel No results found for: CHOL, TRIG, HDL, CHOLHDL, VLDL, LDLCALC, LDLDIRECT    Wt Readings from Last 3 Encounters:  11/15/17 172 lb (78 kg)  11/18/15 171 lb 9.6 oz (77.8 kg)  09/07/13 163 lb (73.9 kg)      Other studies Reviewed: Additional studies/ records that were reviewed today include: Labs. Review of the above records demonstrates:  Please see elsewhere in the note.     ASSESSMENT AND PLAN:   STATUS POST AVR:    He had a bioprosthetic aortic valve replacement 6 years ago.  Can repeat an echocardiogram to follow-up stability.  HTN: Blood pressures controlled.  He will continue the meds as listed.  DYSLIPIDEMIA: LDL was 104 with an HDL of 31.  He has not tolerated statins.  No change in therapy is indicated.   Current medicines are reviewed at length with the patient today.  The patient does not have concerns regarding medicines.  The following changes have been made:  no change  Labs/ tests ordered today include:  Echo  Orders Placed This Encounter  Procedures  . EKG 12-Lead  . ECHOCARDIOGRAM COMPLETE     Disposition:   FU with me in one year.     Signed, Rollene RotundaJames Ellorie Kindall, MD  11/15/2017 9:35 AM    [facility]

## 2017-11-15 ENCOUNTER — Ambulatory Visit (INDEPENDENT_AMBULATORY_CARE_PROVIDER_SITE_OTHER): Payer: Medicare Other | Admitting: Cardiology

## 2017-11-15 ENCOUNTER — Encounter: Payer: Self-pay | Admitting: Cardiology

## 2017-11-15 VITALS — BP 132/85 | HR 55 | Ht 69.0 in | Wt 172.0 lb

## 2017-11-15 DIAGNOSIS — I10 Essential (primary) hypertension: Secondary | ICD-10-CM

## 2017-11-15 DIAGNOSIS — Z953 Presence of xenogenic heart valve: Secondary | ICD-10-CM | POA: Diagnosis not present

## 2017-11-15 NOTE — Patient Instructions (Signed)

## 2017-11-16 ENCOUNTER — Ambulatory Visit (HOSPITAL_COMMUNITY): Payer: Medicare Other | Attending: Internal Medicine

## 2017-11-16 ENCOUNTER — Other Ambulatory Visit: Payer: Self-pay

## 2017-11-16 DIAGNOSIS — Z953 Presence of xenogenic heart valve: Secondary | ICD-10-CM | POA: Diagnosis not present

## 2017-11-16 DIAGNOSIS — E785 Hyperlipidemia, unspecified: Secondary | ICD-10-CM | POA: Diagnosis not present

## 2017-11-16 DIAGNOSIS — I10 Essential (primary) hypertension: Secondary | ICD-10-CM | POA: Insufficient documentation

## 2017-11-19 ENCOUNTER — Telehealth: Payer: Self-pay | Admitting: Cardiology

## 2017-11-19 NOTE — Telephone Encounter (Signed)
Follow Up:; ° ° °Returning your call. °

## 2017-11-22 ENCOUNTER — Encounter: Payer: Self-pay | Admitting: Cardiology

## 2017-11-22 NOTE — Telephone Encounter (Signed)
[facility] via Epic

## 2017-11-22 NOTE — Telephone Encounter (Addendum)
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 Asking to get this [facility]ap [facility] @ [phone]-pls call when done [phone]

## 2018-06-01 ENCOUNTER — Telehealth: Payer: Self-pay | Admitting: Cardiology

## 2018-06-01 NOTE — Progress Notes (Signed)
Cardiology Office Note   Date:  06/02/2018   ID:  John Boyd, DOB Jan 18, 1951, MRN [id number]  PCP:  Elissa HeftyGalbreath, John W, MD  Cardiologist:   Rollene RotundaJames Viann Nielson, MD   No chief complaint on file.     History of Present Illness: John Boyd is a 67 y.o. male who presents for followup of aortic stenosis s/p AVR.    I saw him in 2017 and the AVR was functioning well on follow up echo.  He called recently with pain around his sternum and was added to my schedule.  He said that this only happens when he has been walking a long distance and at about 10 minutes he will get some sharp lower sternal discomfort.  It is not positional.  It does not happen with inspiration.  There is no cough fevers or chills.  He has been active although he had some problems with ankle pain and plantar fasciitis which is limiting him a little bit.  He did not have a sternotomy but had a minithoracotomy.  He never really had any discomfort with this.  He was worried because yesterday blood pressure was slightly elevated but is back down to baseline.   Past Medical History:  Diagnosis Date  . Aortic stenosis 05/06/2012  . Bicuspid aortic valve   . Hematuria   . Hyperlipidemia   . Hypertension    x16 years  . Migraine   . S/P aortic valve replacement with bioprosthetic valve 06/02/2012   [facility] via right mini thoracotomy    Past Surgical History:  Procedure Laterality Date  . AORTIC VALVE REPLACEMENT  06/02/2012   Procedure: MINIMALLY INVASIVE AORTIC VALVE REPLACEMENT (AVR);  Surgeon: Purcell Nailslarence H Owen, MD;  [facility];  Service: Open Heart Surgery;  Laterality: N/A;  . CARDIAC CATHETERIZATION    . CLOSED MANIPULATION SHOULDER    . CYSTOSCOPY    . EXPLORATION POST OPERATIVE OPEN HEART  06/02/2012   Procedure: EXPLORATION POST OPERATIVE OPEN HEART;  Surgeon: Purcell Nailslarence H Owen, MD;  [facility];  Service: Open Heart Surgery;  Laterality: N/A;  . HIP  ARTHROSCOPY     left hip  . INTRAOPERATIVE TRANSESOPHAGEAL ECHOCARDIOGRAM  06/02/2012   Procedure: INTRAOPERATIVE TRANSESOPHAGEAL ECHOCARDIOGRAM;  Surgeon: Purcell Nailslarence H Owen, MD;  [facility];  Service: Open Heart Surgery;  Laterality: N/A;     Current Outpatient Medications  Medication Sig Dispense Refill  . amoxicillin (AMOXIL) 500 MG capsule Take 4 tablet 1 hour before procedure. For dental work only 4 capsule 1  . aspirin EC 81 MG tablet Take 81 mg by mouth daily.    . carbidopa-levodopa (SINEMET IR) 25-100 MG tablet Take 1 tablet by mouth as directed.    . fluticasone (FLONASE) 50 MCG/ACT nasal spray Place 1 spray into the nose daily as needed. Nasal allergies    . Ibuprofen (ADVIL) 200 MG CAPS Take 4 capsules (800 mg total) by mouth as needed.    Marland Kitchen. lisinopril (PRINIVIL,ZESTRIL) 5 MG tablet TAKE 1 TABLET (5 MG TOTAL) BY MOUTH DAILY. 30 tablet 3  . loratadine (CLARITIN) 10 MG tablet Take 10 mg by mouth as needed for allergies.    . meloxicam (MOBIC) 15 MG tablet Take 1 tablet by mouth daily.     No current facility-administered medications for this visit.     Allergies:   Patient has no known allergies.    ROS:  Please see the history of present illness.  Otherwise, review of systems are positive for none.   All other systems are reviewed and negative.    PHYSICAL EXAM: VS:  BP 128/84   Pulse 62   Ht 5\' 10"  (1.778 m)   Wt 175 lb 9.6 oz (79.7 kg)   BMI 25.20 kg/m  , BMI Body mass index is 25.2 kg/m. GENERAL:  Well appearing NECK:  No jugular venous distention, waveform within normal limits, carotid upstroke brisk and symmetric, no bruits, no thyromegaly LUNGS:  Clear to auscultation bilaterally CHEST: Well-healed thoracotomy scar, no tenderness HEART:  PMI not displaced or sustained,S1 and S2 within normal limits, no S3, no S4, no clicks, no rubs, 2 out of 6 apical systolic murmur radiating slightly up aortic outflow tract, no diastolic murmurs ABD:  Flat, positive bowel  sounds normal in frequency in pitch, no bruits, no rebound, no guarding, no midline pulsatile mass, no hepatomegaly, no splenomegaly EXT:  2 plus pulses throughout, no edema, no cyanosis no clubbing  EKG:  EKG is ordered today.   The ekg ordered today demonstrates normal sinus rhythm, rate 62, axis within normal limits, intervals within normal limits, no acute ST-T wave changes.   Recent Labs: No results found for requested labs within last 8760 hours.    Lipid Panel No results found for: CHOL, TRIG, HDL, CHOLHDL, VLDL, LDLCALC, LDLDIRECT    Wt Readings from Last 3 Encounters:  06/02/18 175 lb 9.6 oz (79.7 kg)  11/15/17 172 lb (78 kg)  11/18/15 171 lb 9.6 oz (77.8 kg)      Other studies Reviewed: Additional studies/ records that were reviewed today include: Labs, cath, echo. Review of the above records demonstrates:     ASSESSMENT AND PLAN:   STATUS POST AVR:    He had an echocardiogram earlier this year and his valve replacement was well-seated and without degeneration.  He has a slight systolic murmur but I do not think this is changed based on exam.  I do not think his pain is related to this.  No further imaging is suggested.   CHEST PAIN: His chest pain is atypical.  I went back and looked at the 2013 cardiac cath and he had no coronary disease.  I think the pretest probability of obstructive coronary disease is very low and I do not think that further work-up with stress testing is indicated.  I think this is probably non-anginal discomfort and he should follow back up with his primary provider if it continues.  HTN: His blood pressure is controlled.  He will continue the meds as listed.  He was worried about his blood pressure going up but it is not been elevated otherwise and is back to baseline.  No change in therapy.  DYSLIPIDEMIA: LDL was his LDL was 98, HDL was 34, triglycerides 130.  He is instructed on diet and this is managed medically by his primary provider.  I  did review these labs.     Current medicines are reviewed at length with the patient today.  The patient does not have concerns regarding medicines.  The following changes have been made: None  Labs/ tests ordered today include:  None  Orders Placed This Encounter  Procedures  . EKG 12-Lead     Disposition:   FU with me in one year.    Signed, [name], MD  06/02/2018 9:10 AM    [facility]

## 2018-06-01 NOTE — Telephone Encounter (Signed)
New message   Patient states that he is having pain around his sternum. Please call patient to discuss.

## 2018-06-01 NOTE — Telephone Encounter (Signed)
Returned call to patient no answer.LMTC. 

## 2018-06-01 NOTE — Telephone Encounter (Signed)
Received call back from patient.He stated he has been having pain around sternum off and on for the past 2 months.No pain at present.Appointment scheduled with Dr.Hochrein 06/02/18 at 8:20 am.

## 2018-06-02 ENCOUNTER — Ambulatory Visit (INDEPENDENT_AMBULATORY_CARE_PROVIDER_SITE_OTHER): Payer: Medicare Other | Admitting: Cardiology

## 2018-06-02 ENCOUNTER — Encounter: Payer: Self-pay | Admitting: Cardiology

## 2018-06-02 VITALS — BP 128/84 | HR 62 | Ht 70.0 in | Wt 175.6 lb

## 2018-06-02 DIAGNOSIS — I10 Essential (primary) hypertension: Secondary | ICD-10-CM | POA: Diagnosis not present

## 2018-06-02 DIAGNOSIS — Z953 Presence of xenogenic heart valve: Secondary | ICD-10-CM

## 2018-06-02 DIAGNOSIS — R079 Chest pain, unspecified: Secondary | ICD-10-CM

## 2018-06-02 NOTE — Patient Instructions (Signed)
Medication Instructions:  Your Physician recommend you continue on your current medication as directed.    If you need a refill on your cardiac medications before your next appointment, please call your pharmacy.   Lab work: None   Testing/Procedures: None  Follow-Up: At [facility], you and your health needs are our priority.  As part of our continuing mission to provide you with exceptional heart care, we have created designated Provider Care Teams.  These Care Teams include your primary Cardiologist (physician) and Advanced Practice Providers (APPs -  Physician Assistants and Nurse Practitioners) who all work together to provide you with the care you need, when you need it. You will need a follow up appointment in 1 years.  Please call our office 2 months in advance to schedule this appointment.  You may see Rollene RotundaJames Hochrein, MD or one of the following Advanced Practice Providers on your designated Care Team:   Theodore DemarkRhonda Barrett, PA-C . Joni ReiningKathryn Lawrence, DNP, ANP

## 2018-07-04 ENCOUNTER — Encounter: Payer: Self-pay | Admitting: Neurology
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
## 2018-07-18 ENCOUNTER — Encounter: Payer: Self-pay | Admitting: Neurology

## 2018-07-18 ENCOUNTER — Ambulatory Visit (INDEPENDENT_AMBULATORY_CARE_PROVIDER_SITE_OTHER): Payer: Medicare Other | Admitting: Neurology

## 2018-07-18 VITALS — BP 110/70 | HR 78 | Ht 70.0 in | Wt 171.4 lb

## 2018-07-18 DIAGNOSIS — G529 Cranial nerve disorder, unspecified: Secondary | ICD-10-CM

## 2018-07-18 DIAGNOSIS — G20 Parkinson's disease: Secondary | ICD-10-CM | POA: Diagnosis not present

## 2018-08-04 ENCOUNTER — Telehealth: Payer: Self-pay | Admitting: Neurology

## 2018-08-04 NOTE — Telephone Encounter (Signed)
I heard back from [place] with [facility] and she states since patient was scanned in [place] and not [place] they may not be able to push films to [place]. She will bring by a disc of films.

## 2018-08-04 NOTE — Telephone Encounter (Signed)
[facility] states this has been pushed to [place]. Dr. Arbutus Leas Lorain Childes.

## 2018-08-04 NOTE — Telephone Encounter (Signed)
Message sent to [facility] to have this added.

## 2018-08-04 NOTE — Telephone Encounter (Signed)
Received copy of MRI brain report from [facility], which is reported to be normal.  [place], this is not within [place].  Please contact [facility] or have Eber Jones do so.

## 2018-09-16 ENCOUNTER — Telehealth: Payer: Self-pay | Admitting: Neurology

## 2018-09-16 NOTE — Telephone Encounter (Signed)
Please advise which medication you would like to start him on.  Thank you.

## 2018-09-16 NOTE — Telephone Encounter (Signed)
Will you set him up for a visit?

## 2018-09-16 NOTE — Telephone Encounter (Signed)
Noted  

## 2018-09-16 NOTE — Telephone Encounter (Signed)
Pt wants to start the new Parkinson medication that Dr Tat talked about with him at last visit he was not sure of the name of the medication

## 2018-09-16 NOTE — Telephone Encounter (Signed)
Please schedule evisit so we can discuss

## 2018-09-16 NOTE — Telephone Encounter (Signed)
LMOM for Evisit  °

## 2018-09-28 NOTE — Progress Notes (Signed)
Virtual Visit via Video Note The purpose of this virtual visit is to provide medical care while limiting exposure to the novel coronavirus.    Consent was obtained for video visit:  Yes.   Answered questions that patient had about telehealth interaction:  Yes.   I discussed the limitations, risks, security and privacy concerns of performing an evaluation and management service by telemedicine. I also discussed with the patient that there may be a patient responsible charge related to this service. The patient expressed understanding and agreed to proceed.  Pt location: Home Physician Location: home Name of referring provider:  Elissa HeftyGalbreath, John W, MD I connected with John Bayimothy L Killough at patients initiation/request on 09/29/2018 at 10:15 AM EDT by video enabled telemedicine application and verified that I am speaking with the correct person using two identifiers. Pt MRN:  [id number] Pt DOB:  06-30-1950 Video Participants:  John Boyd;  Wife who supplements the history   History of Present Illness:  Patient is seen today in follow-up for newly diagnosed Parkinson's disease.  Last visit, he did not want to start on any medication.  However, since that time he has changed his mind.  I wanted to see him back in a visit before we started medication, so that we could talk in detail about the medicines.  His L hand may have a little more tremor.  Since our last visit, he has had one fall when pulling out a wisteria vine that was troublesome to get out and he was pulling out the vine.  No lightheadedness or near syncope.  No hallucinations.  Mood has been good.  Only going out for grocery shopping.   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  Patient did have an MRI of the brain since our last visit.  I only got a report and not the films from [facility].  It was reported to be normal.  Reviewed with pt/wife   Observations/Objective:   Vitals:   09/29/18 1019  BP: 127/82  Pulse:  70  Weight: 165 lb (74.8 kg)   GEN:  The patient appears stated age and is in NAD.  Neurological examination:  Orientation: The patient is alert and oriented x3. Cranial nerves: There is good facial symmetry. There is mild facial hypomimia.  The speech is fluent and clear. Soft palate rises symmetrically and there is no tongue deviation. Hearing is intact to conversational tone. Motor: Strength is at least antigravity x 4.   Shoulder shrug is equal and symmetric.  There is no pronator drift.  Movement examination: Tone: unable Abnormal movements: none (but couldn't see hands at rest well via video) Coordination:  There is mild decremation with RAM's, with finger taps on the left (unable to see feet) Gait and Station: The patient has no difficulty arising out of a deep-seated chair without the use of the hands. The patient's stride length is good.      Assessment and Plan:   1.  Parkinson's disease  -Discussed nature and etiology.  -Discussed importance of dermatologic visits.  Discussed that it is to be thought that Parkinson's medications increased risk of melanoma, but it is now believed that the disease itself increases risk.  He needs to get yearly skin checks.  -Discussed medications in detail.  Discussed levodopa versus the agonist medications.  He doesn't want levodopa as he thought he had flulike side effects in the past.  I told him we likely will need to go  back to some version of this in the future.  Ultimately, we decided to start pramipexole and work to 0.5 mg 3 times per day.  Discussed in detail the risks, benefits, and side effects of the medication.  Discussed risks which included but were not limited to compulsive behaviors and sleep attacks.  Patient expressed understanding.  -Patient usually drives a tour bus.  He asks me about this today.  At this point, I do not have an objection to this, but I do want him to get up on pramipexole and make sure he tolerates the  medication before driving again.  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  He understands that.  -Patient and wife asked many questions and I answered them to the best of my ability.  -Discussed in detail Covid-19 and risk factors for this, including age and PD.  Discussed importance of social distancing.  Discussed importance of staying home at all times, as is feasible.  Discussed taking advantage of grocery store hours for the elderly.  Pt expressed understanding.  Follow Up Instructions:    -I discussed the assessment and treatment plan with the patient. The patient was provided an opportunity to ask questions and all were answered. The patient agreed with the plan and demonstrated an understanding of the instructions.   The patient was advised to call back or seek an in-person evaluation if the symptoms worsen or if the condition fails to improve as anticipated.    Total Time spent in visit with the patient was:  30 min, of which more than 50% of the time was spent in counseling and/or coordinating care on safety with med.   Pt understands and agrees with the plan of care outlined.     Kerin Salen, DO

## 2018-09-29 ENCOUNTER — Encounter: Payer: Self-pay | Admitting: *Deleted

## 2018-09-29 ENCOUNTER — Other Ambulatory Visit: Payer: Self-pay

## 2018-09-29 ENCOUNTER — Telehealth (INDEPENDENT_AMBULATORY_CARE_PROVIDER_SITE_OTHER): Payer: Medicare Other | Admitting: Neurology

## 2018-09-29 DIAGNOSIS — G20 Parkinson's disease: Secondary | ICD-10-CM | POA: Diagnosis not present

## 2018-09-29 MED ORDER — PRAMIPEXOLE DIHYDROCHLORIDE 0.125 MG PO TABS: 0.1250 mg | ORAL_TABLET | Freq: Three times a day (TID) | ORAL | 0 refills | Status: DC

## 2018-09-29 MED ORDER — PRAMIPEXOLE DIHYDROCHLORIDE 0.5 MG PO TABS: 0.5000 mg | ORAL_TABLET | Freq: Three times a day (TID) | ORAL | 1 refills | Status: DC

## 2018-10-06 ENCOUNTER — Telehealth: Payer: Self-pay | Admitting: Neurology

## 2018-10-06 DIAGNOSIS — G20 Parkinson's disease: Secondary | ICD-10-CM

## 2018-10-06 NOTE — Telephone Encounter (Signed)
Patient called regarding his Pramipexole .125. He said that he is short 21 Pills. He uses [facility] [address]. Thanks

## 2018-10-06 NOTE — Telephone Encounter (Signed)
Please apologize for me!  He is right.  Please send in pramipexole, 0.125 mg, 1 po tid as directed #21.  RF 0

## 2018-10-07 MED ORDER — PRAMIPEXOLE DIHYDROCHLORIDE 0.125 MG PO TABS: 0.1250 mg | ORAL_TABLET | Freq: Three times a day (TID) | ORAL | 0 refills | Status: DC

## 2018-10-07 NOTE — Telephone Encounter (Signed)
rx e-scribed per Dr Arbutus Leas.

## 2018-10-07 NOTE — Telephone Encounter (Signed)
Notified the patient additional 21 [facility] to [facility]. Patient verbalized understanding, and told me to tell Dr Tat, thank you.

## 2018-11-17 ENCOUNTER — Ambulatory Visit: Payer: Medicare Other | Admitting: Neurology

## 2018-11-24 ENCOUNTER — Ambulatory Visit: Payer: Medicare Other | Admitting: Neurology

## 2018-11-28 NOTE — Telephone Encounter (Signed)
Pt last seen 09/29/18 telemedicine visit Follow up 02/01/19

## 2018-11-30 NOTE — Progress Notes (Signed)
Virtual Visit via Video Note The purpose of this virtual visit is to provide medical care while limiting exposure to the novel coronavirus.    Consent was obtained for video visit:  Yes.   Answered questions that patient had about telehealth interaction:  Yes.   I discussed the limitations, risks, security and privacy concerns of performing an evaluation and management service by telemedicine. I also discussed with the patient that there may be a patient responsible charge related to this service. The patient expressed understanding and agreed to proceed.  Pt location: Home Physician Location: home Name of referring provider:  Elmer Bales, MD I connected with Gloriajean Dell at patients initiation/request on 12/02/2018 at 10:15 AM EDT by video enabled telemedicine application and verified that I am speaking with the correct person using two identifiers. Pt MRN:  [id number] Pt DOB:  [birth year] Video Participants:  Gloriajean Dell;  wife   History of Present Illness:  Patient seen today in follow-up for Parkinson's disease.  He was started on pramipexole on September 29, 2018.  He is now on pramipexole 0.5 mg 3 times per day.  He has tolerated the medication well.  He denies any daytime hypersomnolence and has not intentionally fallen asleep.  He has not had any sleep attacks.  He has not had any compulsive behaviors.  No falls.  He has had some constipation that he initially attributed to the medication, but I told him that this was really more associated with the disease.  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  Noting lowering of BP.  Now taking his BP med at night so "it wears off before I get out in the yard and do yard work."  Not sleeping well at night so somewhat fatigued in the day  but also working in the yard a lot.  Notes handwriting is better.  Wife thinks that tremor is a little less at times, but patient is not so sure.  Wife no longer notes it at night, when she used to note that in the past.   Current Outpatient Medications on File Prior to Visit  Medication Sig Dispense Refill   amoxicillin (AMOXIL) 500 MG capsule Take 4 tablet 1 hour before procedure. For dental work only 4 capsule 1   aspirin EC 81 MG tablet Take 81 mg by mouth daily.     fluticasone (FLONASE) 50 MCG/ACT nasal spray Place 1 spray into the nose daily as needed. Nasal allergies     Ibuprofen (ADVIL) 200 MG CAPS Take 4 capsules (800 mg total) by mouth as needed.     lisinopril (PRINIVIL,ZESTRIL) 5 MG tablet TAKE 1 TABLET (5 MG TOTAL) BY MOUTH DAILY. 30 tablet 3   loratadine (CLARITIN) 10 MG tablet Take 10 mg by mouth as needed for allergies.     pramipexole (MIRAPEX) 0.125 MG tablet Take 1 tablet (0.125 mg total) by mouth 3 (three) times daily. As directed on email instructions 42 tablet 0   pramipexole (MIRAPEX) 0.5 MG tablet Take 1 tablet (0.5 mg total) by mouth 3 (three) times daily. 270 tablet 1   Docusate Sodium (COLACE PO) Take by mouth as needed.     No current facility-administered medications on file prior to visit.      Observations/Objective:  Vitals:   12/02/18 0827  BP: 104/73  Pulse: 72  Temp: 97.8 F (36.6 C)  Weight: 172 lb (78 kg)  Height: 5\' 10"  (1.778 m)   GEN:  The patient appears stated age and is in NAD.  Neurological examination:  Orientation: MOCA blind Montreal Cognitive Assessment  12/01/2018  Attention: Read list of digits (0/2) 2  Attention: Read list of letters (0/1) 1  Attention: Serial 7 subtraction starting at 100 (0/3) 3  Language: Repeat phrase (0/2) 2  Language : Fluency (0/1) 1  Abstraction (0/2) 2  Delayed Recall (0/5) 3  Orientation (0/6) 6   20/22 Cranial nerves: There is good facial symmetry. There is facial hypomimia.  The  speech is fluent and clear. Soft palate rises symmetrically and there is no tongue deviation. Hearing is intact to conversational tone. Motor: Strength is at least antigravity x 4.   Shoulder shrug is equal and symmetric.  There is no pronator drift.  Movement examination: Tone: unable Abnormal movements: none (but video mostly on face and proximal UE today) Coordination: Not tested today Gait and Station: Not tested today    Assessment and Plan:   1.  Parkinson's disease, Hoehn and Yoehr stage I  -Patient is doing well on pramipexole 0.5 mg 3 times per day.  He has had no sleep attacks.  No compulsive behaviors.  -Discussed with the patient that I personally have no objection to him driving, but that I do not do commercial evaluations.  He understands that, but states that he still needs a letter from me.  I will be happy to provide him a letter that he has H&Y stage I disease and that he is tolerating the medication well.  From my standpoint, he has no cognitive or motor deficits that should impair him from driving, although we have not done a comprehensive neuropsychological test, because he has not needed it from my standpoint.  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  Hx HTN  -reports BP is lowering.  He has had systolic blood pressures in the 90s.  Reports that he has tried to hold his blood pressure medications, but blood pressure will go into the 130s systolic and I told him that that was not bad, but he reports he gets a headache with blood pressures in the 130s.  He and I talked about the importance of drinking water  -Asked him to make a follow-up appointment with his primary care physician.  3.  Constipation  -He has not added a stool softener after our last conversation through email  -As above, talked about the importance of drinking water.    -Can add MiraLAX as needed.  Follow Up Instructions:   He has an appointment in August with me.  He asks if he needs to keep that.  I told him he can keep it if he would like, or he can reschedule that for later in the fall.  -I discussed the assessment and treatment plan with the patient. The patient was provided an opportunity to ask questions and all were answered. The patient agreed with the plan and demonstrated an understanding of the instructions.   The patient was advised to call back or seek an in-person evaluation if the symptoms worsen or if the condition fails to improve as anticipated.    Total Time spent in visit with the patient was:  125  min, of which more than 50% of the time was spent in counseling as above.   Pt understands and agrees with the plan of care outlined.     Kerin Salenebecca Jasminne Mealy, DO

## 2018-12-01 ENCOUNTER — Other Ambulatory Visit: Payer: Self-pay

## 2018-12-01 NOTE — Progress Notes (Signed)
MOCA blind performed today by Milana Kidney   Result total: 20/22  charted

## 2018-12-02 ENCOUNTER — Encounter: Payer: Self-pay | Admitting: Neurology

## 2018-12-02 ENCOUNTER — Telehealth (INDEPENDENT_AMBULATORY_CARE_PROVIDER_SITE_OTHER): Payer: Medicare Other | Admitting: Neurology

## 2018-12-02 DIAGNOSIS — K5901 Slow transit constipation: Secondary | ICD-10-CM | POA: Diagnosis not present

## 2018-12-02 DIAGNOSIS — G20 Parkinson's disease: Secondary | ICD-10-CM

## 2018-12-02 DIAGNOSIS — G20A1 Parkinson's disease without dyskinesia, without mention of fluctuations: Secondary | ICD-10-CM

## 2018-12-05 NOTE — Telephone Encounter (Signed)
LETTER received signed by provider Fax to [facility] per patient request  Pt aware via mychart

## 2019-02-01 ENCOUNTER — Ambulatory Visit: Payer: Medicare Other | Admitting: Neurology

## 2019-02-13 ENCOUNTER — Other Ambulatory Visit: Payer: Self-pay | Admitting: Neurology

## 2019-02-13 NOTE — Telephone Encounter (Signed)
Requested Prescriptions   Pending Prescriptions Disp Refills  . pramipexole (MIRAPEX) 0.5 MG tablet [Pharmacy Med Name: PRAMIPEXOLE 0.5 MG TABLET] 270 tablet 1    Sig: TAKE 1 TABLET (0.5 MG TOTAL) BY MOUTH 3 (THREE) TIMES DAILY.   Rx last filled: 09/29/18 #270 1 refills  Pt last seen: 12/02/18 Follow up appt scheduled:05/16/2019

## 2019-04-30 DIAGNOSIS — E785 Hyperlipidemia, unspecified: Secondary | ICD-10-CM | POA: Insufficient documentation

## 2019-04-30 DIAGNOSIS — R079 Chest pain, unspecified: Secondary | ICD-10-CM | POA: Insufficient documentation

## 2019-04-30 NOTE — Progress Notes (Signed)
Cardiology Office Note   Date:  05/01/2019   ID:  John Boyd, DOB 1951-02-14, MRN [id number]  PCP:  Elissa Hefty, MD  Cardiologist:   [name], MD   Chief Complaint  Patient presents with  . AVR      History of Present Illness: John Boyd is a 68 y.o. male who presents for followup of aortic stenosis s/p AVR.    I saw him in 2019 and the AVR was functioning well on follow up echo.  Since I last saw him he has been okay.  He is blood pressures been a little labile.  He has been diagnosed with Parkinson's and managed for this.  At times he is reduced his lisinopril under his doctor's direction but his blood pressure started creeping back up so he went back up on it.  He is not had any new chest pressure, neck or arm discomfort.  He has had no new palpitations, presyncope or syncope.  Substernal discomfort that he had previously seems to be improved  He was worried because yesterday blood pressure was slightly elevated but is back down to baseline.   Past Medical History:  Diagnosis Date  . Aortic stenosis 05/06/2012  . Bicuspid aortic valve   . Hematuria   . Hyperlipidemia   . Hypertension    x16 years  . Migraine   . S/P aortic valve replacement with bioprosthetic valve 06/02/2012   17mm [facility] Ease pericardial tissue valve via right mini thoracotomy    Past Surgical History:  Procedure Laterality Date  . AORTIC VALVE REPLACEMENT  06/02/2012   Procedure: MINIMALLY INVASIVE AORTIC VALVE REPLACEMENT (AVR);  Surgeon: Purcell Nails, MD;  Location: [facility] OR;  Service: Open Heart Surgery;  Laterality: N/A;  . CARDIAC CATHETERIZATION    . CLOSED MANIPULATION SHOULDER    . CYSTOSCOPY    . EXPLORATION POST OPERATIVE OPEN HEART  06/02/2012   Procedure: EXPLORATION POST OPERATIVE OPEN HEART;  Surgeon: Purcell Nails, MD;  Location: [facility] OR;  Service: Open Heart Surgery;  Laterality: N/A;  . HIP ARTHROSCOPY     left hip  . INTRAOPERATIVE  TRANSESOPHAGEAL ECHOCARDIOGRAM  06/02/2012   Procedure: INTRAOPERATIVE TRANSESOPHAGEAL ECHOCARDIOGRAM;  Surgeon: Purcell Nails, MD;  Location[facility] OR;  Service: Open Heart Surgery;  Laterality: N/A;     Current Outpatient Medications  Medication Sig Dispense Refill  . amoxicillin (AMOXIL) 500 MG capsule Take 4 tablet 1 hour before procedure. For dental work only 4 capsule 1  . aspirin EC 81 MG tablet Take 81 mg by mouth daily.    Tery Sanfilippo Sodium (COLACE PO) Take by mouth as needed.    . fluticasone (FLONASE) 50 MCG/ACT nasal spray Place 1 spray into the nose daily as needed. Nasal allergies    . lisinopril (ZESTRIL) 10 MG tablet Take 1 tablet by mouth daily.    Marland Kitchen loratadine (CLARITIN) 10 MG tablet Take 10 mg by mouth as needed for allergies.    . pramipexole (MIRAPEX) 0.5 MG tablet TAKE 1 TABLET (0.5 MG TOTAL) BY MOUTH 3 (THREE) TIMES DAILY. 270 tablet 1   No current facility-administered medications for this visit.     Allergies:   Latex    ROS:  Please see the history of present illness.   Otherwise, review of systems are positive for tremor, gout.   All other systems are reviewed and negative.    PHYSICAL EXAM: VS:  BP 126/82   Pulse (!) 57  Ht 5\' 10"  (1.778 m)   Wt 174 lb (78.9 kg)   SpO2 98%   BMI 24.97 kg/m  , BMI Body mass index is 24.97 kg/m. GENERAL:  Well appearing NECK:  No jugular venous distention, waveform within normal limits, carotid upstroke brisk and symmetric, no bruits, no thyromegaly LUNGS:  Clear to auscultation bilaterally CHEST: Well-healed surgical scars HEART:  PMI not displaced or sustained,S1 and S2 within normal limits, no S3, no S4, no clicks, no rubs, 2 out of 6 brief right upper sternal border systolic murmur, no diastolic murmurs ABD:  Flat, positive bowel sounds normal in frequency in pitch, no bruits, no rebound, no guarding, no midline pulsatile mass, no hepatomegaly, no splenomegaly EXT:  2 plus pulses throughout, no edema, no cyanosis  no clubbing NEURO: Left hand tremor   EKG:  EKG is  ordered today. Sinus rhythm, rate 57, axis within normal limits, intervals within normal limits, no acute ST-T wave changes.     Recent Labs: No results found for requested labs within last 8760 hours.    Lipid Panel No results found for: CHOL, TRIG, HDL, CHOLHDL, VLDL, LDLCALC, LDLDIRECT    Wt Readings from Last 3 Encounters:  05/01/19 174 lb (78.9 kg)  12/02/18 172 lb (78 kg)  09/29/18 165 lb (74.8 kg)      Other studies Reviewed: Additional studies/ records that were reviewed today include: Labs Review of the above records demonstrates:  NA   ASSESSMENT AND PLAN:   STATUS POST AVR:     His exam is unchanged.  He had a stable valve last year.  No reason for further imaging.  He understands endocarditis prophylaxis.   HTN: His blood pressure is somewhat labile.  We talked about as needed dosing of his lisinopril to 5 mg if he runs low with hemoglobin recently.  He has a good understanding of this.    Current medicines are reviewed at length with the patient today.  The patient does not have concerns regarding medicines.  The following changes have been made: None  Labs/ tests ordered today include:  None  Orders Placed This Encounter  Procedures  . EKG 12-Lead     Disposition:   FU with me as needed.  He might be wanting to switch his care to [place] in [place] since he lives.   Signed, Minus Breeding, MD  05/01/2019 10:47 AM    [facility]

## 2019-05-01 ENCOUNTER — Ambulatory Visit (INDEPENDENT_AMBULATORY_CARE_PROVIDER_SITE_OTHER): Payer: Medicare Other | Admitting: Cardiology

## 2019-05-01 ENCOUNTER — Other Ambulatory Visit: Payer: Self-pay

## 2019-05-01 ENCOUNTER — Encounter: Payer: Self-pay | Admitting: Cardiology

## 2019-05-01 VITALS — BP 126/82 | HR 57 | Ht 70.0 in | Wt 174.0 lb

## 2019-05-01 DIAGNOSIS — Z953 Presence of xenogenic heart valve: Secondary | ICD-10-CM

## 2019-05-01 DIAGNOSIS — R079 Chest pain, unspecified: Secondary | ICD-10-CM | POA: Diagnosis not present

## 2019-05-01 DIAGNOSIS — E785 Hyperlipidemia, unspecified: Secondary | ICD-10-CM | POA: Diagnosis not present

## 2019-05-01 DIAGNOSIS — I10 Essential (primary) hypertension: Secondary | ICD-10-CM | POA: Diagnosis not present

## 2019-05-01 NOTE — Patient Instructions (Signed)
Medication Instructions:  Your physician recommends that you continue on your current medications as directed. Please refer to the Current Medication list given to you today.  If you need a refill on your cardiac medications before your next appointment, please call your pharmacy.   Lab work: NONE  Testing/Procedures: NONE  Follow-Up: As needed.      

## 2019-05-15 ENCOUNTER — Encounter: Payer: Self-pay | Admitting: Neurology

## 2019-05-15 NOTE — Progress Notes (Signed)
Virtual Visit via Video Note The purpose of this virtual visit is to provide medical care while limiting exposure to the novel coronavirus.    Consent was obtained for video visit:  Yes.   Answered questions that patient had about telehealth interaction:  Yes.   I discussed the limitations, risks, security and privacy concerns of performing an evaluation and management service by telemedicine. I also discussed with the patient that there may be a patient responsible charge related to this service. The patient expressed understanding and agreed to proceed.  Pt location: Home Physician Location: office Name of referring provider:  Elissa Hefty, MD I connected with John Boyd at patients initiation/request on 05/16/2019 at  1:00 PM EST by video enabled telemedicine application and verified that I am speaking with the correct person using two identifiers. Pt MRN:  [id number] Pt DOB:  1951/04/30 Video Participants:  John Boyd;  wife   History of Present Illness:  Patient seen today in follow-up for Parkinson's disease.  He was started on pramipexole on September 29, 2018.  Patient is currently on pramipexole 0.5 mg 3 times per day.    Denies sleep attacks or compulsive behaviors.  Some sleepy in the daytime but doesn't fall asleep accidentally.  Doesn't sleep as soundly at night.  No falls.  No lightheadedness or near syncope.  He asks about focused ultrasound.  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  Doing yard work but not specific exercise time.  He had some gout this fall but that has resolved.  Allopurinol was recommended but he didn't want to do that at the time.   Current Outpatient Medications on File Prior to Visit  Medication Sig Dispense Refill   amoxicillin (AMOXIL) 500 MG capsule Take 4 tablet 1 hour before procedure. For dental work only 4 capsule 1   aspirin EC 81 MG tablet Take 81 mg by mouth daily.     Docusate Sodium (COLACE PO)  Take by mouth as needed.     fluticasone (FLONASE) 50 MCG/ACT nasal spray Place 1 spray into the nose daily as needed. Nasal allergies     lisinopril (ZESTRIL) 10 MG tablet Take 1 tablet by mouth daily.     loratadine (CLARITIN) 10 MG tablet Take 10 mg by mouth as needed for allergies.     pramipexole (MIRAPEX) 0.5 MG tablet TAKE 1 TABLET (0.5 MG TOTAL) BY MOUTH 3 (THREE) TIMES DAILY. 270 tablet 1   No current facility-administered medications on file prior to visit.      Observations/Objective:   Vitals:   05/15/19 1319  BP: 115/85  Weight: 175 lb (79.4 kg)   GEN:  The patient appears stated age and is in NAD.  Neurological examination:  Orientation: MOCA blind Montreal Cognitive Assessment  12/01/2018  Attention: Read list of digits (0/2) 2  Attention: Read list of letters (0/1) 1  Attention: Serial 7 subtraction starting at 100 (0/3) 3  Language: Repeat phrase (0/2) 2  Language : Fluency (0/1) 1  Abstraction (0/2) 2  Delayed Recall (0/5) 3  Orientation (0/6) 6   20/22 Cranial nerves: There is good facial symmetry. There is facial hypomimia.  The speech is fluent and clear. Soft palate rises symmetrically and there is no tongue deviation. Hearing is intact to conversational tone. Motor: Strength is at least antigravity x 4.   Shoulder shrug is equal and symmetric.  There is no pronator drift.  Movement examination: Tone: unable Abnormal movements: none  Coordination: no decremation, with any form of RAMS, including alternating supination and pronation of the forearm, hand opening and closing, finger taps bilaterally Gait and Station: gets out of the chair without trouble.  Ambulates well in the home.    Assessment and Plan:   1.  Parkinson's disease, Hoehn and Yoehr stage I  -Patient is doing well on pramipexole 0.5 mg 3 times per day.  He has had no sleep attacks.  No compulsive behaviors.  Discussed that there is an extended release version of this medication, since  he does not always remember to take the middle of the day dose (usually does).  Told him to ask his pharmacy/insurance company if there is a great cost differential.  If not, I am happy to prescribe the extended release version.  -No objection from a pure Parkinson's standpoint for the patient driving, including driving a commercial vehicle.  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  He knows that and states that they require a letter from his neurologist.  We will send him that.  -Discussed DBS and focused ultrasound, as the patient brought this topic up.  Discussed that he does not need surgical intervention right now.  Discussed the differences between these types of surgeries, along with risks and benefits of each.  Discussed that generally focused ultrasound is considered second line to DBS surgery.  Patient asked questions and answered those to the best of my ability today.  -Encouraged patient to find a formal exercise program.  2.  Hx HTN  -Monitoring his blood pressure.  His blood pressure has dropped with the course of time, but he has tried to stop his blood pressure medication without success.  We will continue to monitor.   Follow Up Instructions:  4-6 months  -I discussed the assessment and treatment plan with the patient. The patient was provided an opportunity to ask questions and all were answered. The patient agreed with the plan and demonstrated an understanding of the instructions.   The patient was advised to call back or seek an in-person evaluation if the symptoms worsen or if the condition fails to improve as anticipated.    Total Time spent in visit with the patient was:  25 min, of which more than 50% of the time was spent in counseling as above.   Pt understands and agrees with the plan of care outlined.     Alonza Bogus, DO

## 2019-05-16 ENCOUNTER — Encounter: Payer: Self-pay | Admitting: Neurology

## 2019-05-16 ENCOUNTER — Other Ambulatory Visit: Payer: Self-pay

## 2019-05-16 ENCOUNTER — Telehealth (INDEPENDENT_AMBULATORY_CARE_PROVIDER_SITE_OTHER): Payer: Medicare Other | Admitting: Neurology

## 2019-05-16 VITALS — BP 115/85 | Wt 175.0 lb

## 2019-05-16 DIAGNOSIS — I10 Essential (primary) hypertension: Secondary | ICD-10-CM | POA: Diagnosis not present

## 2019-05-16 DIAGNOSIS — G20 Parkinson's disease: Secondary | ICD-10-CM | POA: Diagnosis not present

## 2019-05-16 DIAGNOSIS — G20A1 Parkinson's disease without dyskinesia, without mention of fluctuations: Secondary | ICD-10-CM

## 2019-07-25 ENCOUNTER — Other Ambulatory Visit: Payer: Self-pay | Admitting: Neurology

## 2019-10-03 ENCOUNTER — Telehealth: Payer: Self-pay | Admitting: Cardiology

## 2019-10-03 MED ORDER — AMOXICILLIN 500 MG PO CAPS: ORAL_CAPSULE | ORAL | 1 refills | Status: DC

## 2019-10-03 NOTE — Telephone Encounter (Signed)
*  STAT* If patient is at the pharmacy, call can be transferred to refill team.   1. Which medications need to be refilled? (please list name of each medication and dose if known) amoxicillin (AMOXIL) 500 MG capsule  2. Which pharmacy/location (including street and city if local pharmacy) is medication to be sent to? [id number] - [place], [place] - [address]  3. Do they need a 30 day or 90 day supply? Patient's wife states the patient has a dental appointment scheduled for tomorrow 10/04/19 at 2:00 PM and she is requesting to have medication prior to his appointment. Please assist.

## 2019-10-03 NOTE — Telephone Encounter (Signed)
Amoxicillin refilled. Patient aware.

## 2019-11-14 ENCOUNTER — Ambulatory Visit: Payer: Medicare Other | Admitting: Neurology

## 2019-11-14 NOTE — Progress Notes (Signed)
Assessment/Plan:   1.  Parkinsons Disease with levodopa resistant tremor  -Continue pramipexole, 0.5 mg 3 times per day. Have discussed risk, benefits, and side effects, including but not limited to sleep attacks.   -From a pure Parkinson's standpoint from the patient driving, I have no objection. 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 He understands that fully.  -he asked if lisinopril would cause the tremor and I told him it wouldn't  -I talked to the patient about the logistics associated with DBS therapy and pt asked many detailed questions today.  I talked to the patient about risks/benefits/side effects of DBS therapy.  We talked about risks which included but were not limited to infection, paralysis, intraoperative seizure, death, stroke, bleeding around the electrode.   I talked to patient about fiducial placement 1 week prior to DBS therapy.  I talked to the patient about what to expect in the operating room, including the fact that this is an awake surgery.  We talked about battery placement as well as which is done under general anesthesia, generally approximately one week following the initial surgery.  We also talked about the fact that the patient will need to be off of medications for surgery.  The patient and family were given the opportunity to ask questions, which they did, and I answered them to the best of my ability today.  Discussed with the patient that I wouldn't think that we are ready to dbs yet given that symptoms are mainly unilateral  2. History of hypertension  -Patient has tried to stop his blood pressure medication in the past without success. I talked to him about talking to PCP about potentially decreasing it with lightheadedness.  We will need to continue monitoring.  3.  Follow up is anticipated in the next 6 months, sooner should new neurologic issues arise.   Subjective:   John Boyd was seen today in follow up for Parkinsons disease.  My previous records were reviewed prior to todays visit as well as outside records available to me. One fall - was using a chainsaw and had the chainsaw idle in one hand and holding a branch in another and that branch broke and he fell.  15 min later, he was trying to grab the door on the truck and missed it and fell back.  No other falls.  Pt denies consistent lightheadedness, near syncope.  Does have some BP drops if hot outside and working out but otherwise doesn't.  No hallucinations.  Mood has been good. 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 No daytime hypersomnolence. No falling asleep without warning unless sitting down and watching the TV.  Some insomnia at bedtime - can fall asleep but doesn't stay asleep well.  Current prescribed movement disorder medications: Pramipexole, 0.5 mg 3 times per day (misses 2-3 pills per week - usually middle of the day dosage)   PREVIOUS MEDICATIONS: Sinemet (2 po tid - didn't help tremor)  ALLERGIES:   Allergies  Allergen Reactions  . Latex Rash    CURRENT MEDICATIONS:  Outpatient Encounter Medications as of 11/16/2019  Medication Sig  . amoxicillin (AMOXIL) 500 MG capsule Take 4 tablet 1 hour before procedure. For dental work only  . aspirin EC 81 MG tablet Take 81 mg by mouth daily.  Mariane Baumgarten Sodium (COLACE PO) Take by mouth as needed.  . fluticasone (FLONASE) 50 MCG/ACT nasal spray  Place 1 spray into the nose daily as needed. Nasal allergies  . indomethacin (INDOCIN SR) 75 MG CR capsule TAKE ONE CAPSULE (75 MG DOSE) BY MOUTH 2 (TWO) TIMES DAILY WITH MEALS.  Marland Kitchen lisinopril (ZESTRIL) 10 MG tablet Take 1 tablet by mouth daily.  Marland Kitchen loratadine (CLARITIN) 10 MG tablet Take 10 mg by mouth as needed for allergies.  . pramipexole (MIRAPEX) 0.5 MG tablet TAKE 1 TABLET (0.5 MG TOTAL) BY MOUTH 3 (THREE) TIMES DAILY.   No facility-administered encounter medications on file as of 11/16/2019.     Objective:   PHYSICAL EXAMINATION:    VITALS:   Vitals:   11/16/19 1051  BP: 126/76  Pulse: 67  SpO2: 98%  Weight: 169 lb (76.7 kg)  Height: 5\' 10"  (1.778 m)    GEN:  The patient appears stated age and is in NAD. HEENT:  Normocephalic, atraumatic.  The mucous membranes are moist. The superficial temporal arteries are without ropiness or tenderness. CV:  RRR Lungs:  CTAB Neck/HEME:  There are no carotid bruits bilaterally.  Neurological examination:  Orientation: The patient is alert and oriented x3. Cranial nerves: There is good facial symmetry with min facial hypomimia. The speech is fluent and clear. Soft palate rises symmetrically and there is no tongue deviation. Hearing is intact to conversational tone. Sensation: Sensation is intact to light touch throughout Motor: Strength is at least antigravity x4.  Movement examination: Tone: There is normal tone in the UE/LE Abnormal movements: there is LUE rest tremor Coordination:  There is no decremation with RAM's, with any form of RAMS, including alternating supination and pronation of the forearm, hand opening and closing, finger taps, heel taps and toe taps. Gait and Station: The patient has no difficulty arising out of a deep-seated chair without the use of the hands. The patient's stride length is good.       I have reviewed and interpreted the following labs independently Patient had lab work on May 08, 2019. White blood cells were 8.9, hematocrit 50.8, platelets 214. Hemoglobin 16.5. Sodium was 139, potassium 4.8, chloride 101, CO2 23, BUN 16, creatinine 1.05, glucose 97, AST 23, ALT 17, alkaline phosphatase 108  Total time spent on today's visit was 60 minutes (45 min FTF), including both face-to-face time and nonface-to-face time.  Time included that spent on review of records (prior notes available to me/labs/imaging if pertinent), discussing treatment and goals, answering patient's questions and coordinating  care.  Cc:  May 10, 2019, MD

## 2019-11-16 ENCOUNTER — Other Ambulatory Visit: Payer: Self-pay

## 2019-11-16 ENCOUNTER — Ambulatory Visit (INDEPENDENT_AMBULATORY_CARE_PROVIDER_SITE_OTHER): Payer: Medicare Other | Admitting: Neurology

## 2019-11-16 ENCOUNTER — Encounter: Payer: Self-pay | Admitting: Neurology

## 2019-11-16 VITALS — BP 126/76 | HR 67 | Ht 70.0 in | Wt 169.0 lb

## 2019-11-16 DIAGNOSIS — I10 Essential (primary) hypertension: Secondary | ICD-10-CM

## 2019-11-16 DIAGNOSIS — G20 Parkinson's disease: Secondary | ICD-10-CM | POA: Diagnosis not present

## 2019-11-16 NOTE — Patient Instructions (Signed)
The physicians and staff at [facility] are committed to providing excellent care. You may receive a survey requesting feedback about your experience at our office. We strive to receive "very good" responses to the survey questions. If you feel that your experience would prevent you from giving the office a "very good " response, please contact our office to try to remedy the situation. We may be reached at [phone]. Thank you for taking the time out of your busy day to complete the survey.  

## 2020-01-31 ENCOUNTER — Telehealth: Payer: Self-pay | Admitting: Neurology

## 2020-01-31 NOTE — Telephone Encounter (Signed)
No issues with taking cold meds with pramipexole

## 2020-01-31 NOTE — Telephone Encounter (Signed)
Spoke with pts wife and told her that Dr Tat says there are no issues with him taking any cold medications, she verbalized understanding.

## 2020-01-31 NOTE — Telephone Encounter (Signed)
Patient's wife called in stating the patient has a cold. It's been confirmed it isn't COVID-19. It keeps lingering and she was wondering what he could take for it with his pramipexole? They have Niquil, Dayquil, Benzonate for cough, and Musinex DM at home already.

## 2020-05-07 NOTE — Progress Notes (Signed)
Assessment/Plan:   1.  Parkinsons Disease with levodopa resistant tremor  -Continue pramipexole 0.5 mg 3 times per day.  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  I have no objection to that.  -Understands Parkinson's slightly increases risk for melanoma.  Last saw dermatology on August 11.  Has had history of basal cell.  2.  History of hypertension  -Monitoring his blood pressure as he does have lightheadedness and on antihypertensives.  Has tried in the past to stop unsuccessfully, but wonder if medicines need decreased.   Subjective:   John Boyd was seen today in follow up for Parkinsons disease.  My previous records were reviewed prior to todays visit as well as outside records available to me.  Pt with wife who supplements the history.  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  Able to climb up/down out of vehicle.   Pt denies falls.  Pt with lightheadedness, but no near syncope.  No hallucinations.  Mood has been good.  Has been following with dermatology.  Last visit on August 11.  Notes are reviewed.  Had basal cell removed.  Last saw primary care on September 8.  Current prescribed movement disorder medications: Pramipexole, 0.5 mg 3 times per day (pt admits he generally only takes it bid)   PREVIOUS MEDICATIONS: Sinemet (2 po tid - didn't help tremor)  ALLERGIES:   Allergies  Allergen Reactions  . Latex Rash    CURRENT MEDICATIONS:  Outpatient Encounter Medications as of 05/14/2020  Medication Sig  . allopurinol (ZYLOPRIM) 100 MG tablet Take 200 mg by mouth daily.  Marland Kitchen amoxicillin (AMOXIL) 500 MG capsule Take 4 tablet 1 hour before procedure. For dental work only  . aspirin EC 81 MG tablet Take 81 mg by mouth daily.  . colchicine 0.6 MG tablet Take 0.6 mg by mouth daily.  Tery Sanfilippo Sodium (COLACE PO) Take by mouth as needed.  . fluticasone (FLONASE) 50 MCG/ACT nasal spray Place 1  spray into the nose daily as needed. Nasal allergies  . indomethacin (INDOCIN SR) 75 MG CR capsule As needed  . lisinopril (ZESTRIL) 10 MG tablet Take 1 tablet by mouth daily.  Marland Kitchen loratadine (CLARITIN) 10 MG tablet Take 10 mg by mouth as needed for allergies.  . pramipexole (MIRAPEX) 0.5 MG tablet TAKE 1 TABLET (0.5 MG TOTAL) BY MOUTH 3 (THREE) TIMES DAILY.  . [DISCONTINUED] amoxicillin (AMOXIL) 500 MG capsule Take 4 tablet 1 hour before procedure. For dental work only   No facility-administered encounter medications on file as of 05/14/2020.    Objective:   PHYSICAL EXAMINATION:    VITALS:   Vitals:   05/14/20 0823  BP: (!) 149/91  Pulse: 74  SpO2: 99%  Weight: 177 lb (80.3 kg)  Height: 5\' 10"  (1.778 m)    GEN:  The patient appears stated age and is in NAD. HEENT:  Normocephalic, atraumatic.  The mucous membranes are moist. The superficial temporal arteries are without ropiness or tenderness. CV:  RRR Lungs:  CTAB Neck/HEME:  There are no carotid bruits bilaterally.  Neurological examination:  Orientation: The patient is alert and oriented x3. Cranial nerves: There is good facial symmetry with minimal facial hypomimia. The speech is fluent and clear. Soft palate rises symmetrically and there is no tongue deviation. Hearing is intact to conversational tone. Sensation: Sensation is intact to light touch throughout Motor: Strength is at least  antigravity x4.  Movement examination: Tone: There is mild increased tone in the LUE.   Abnormal movements: there is near constant LUE rest tremor Coordination:  There is no decremation with RAM's, with any form of RAMS, including alternating supination and pronation of the forearm, hand opening and closing, finger taps, heel taps and toe taps. Gait and Station: The patient has no difficulty arising out of a deep-seated chair without the use of the hands. The patient's stride length is good with slightly decreased arm swing on the left.     I have reviewed and interpreted the following labs independently Last labs were done May 08, 2019 via [facility].  White blood cells 8.9, hemoglobin 16.5, hematocrit 50.8 and platelets 214.  Sodium 139, potassium 4.8, chloride 101, CO2 23, BUN 16, creatinine 1.05  Total time spent on today's visit was 25 minutes, including both face-to-face time and nonface-to-face time.  Time included that spent on review of records (prior notes available to me/labs/imaging if pertinent), discussing treatment and goals, answering patient's questions and coordinating care.  Cc:  Elissa Hefty, MD

## 2020-05-13 ENCOUNTER — Other Ambulatory Visit: Payer: Self-pay | Admitting: Neurology

## 2020-05-13 ENCOUNTER — Other Ambulatory Visit: Payer: Self-pay | Admitting: Cardiology

## 2020-05-13 MED ORDER — AMOXICILLIN 500 MG PO CAPS: ORAL_CAPSULE | ORAL | 1 refills | Status: DC

## 2020-05-13 NOTE — Telephone Encounter (Signed)
*  STAT* If patient is at the pharmacy, call can be transferred to refill team.   1. Which medications need to be refilled? (please list name of each medication and dose if known) amoxicillin (AMOXIL) 500 MG capsule  2. Which pharmacy/location (including street and city if local pharmacy) is medication to be sent to? [id number] - [place], [place] - [address]  3. Do they need a 30 day or 90 day supply? Patient's wife states the patient is scheduled to have dental work today 05/13/20 at 3:00 PM and he will need a supply to take an hour prior to appointment. Please assist.

## 2020-05-14 ENCOUNTER — Other Ambulatory Visit: Payer: Self-pay

## 2020-05-14 ENCOUNTER — Ambulatory Visit (INDEPENDENT_AMBULATORY_CARE_PROVIDER_SITE_OTHER): Payer: Medicare Other | Admitting: Neurology

## 2020-05-14 ENCOUNTER — Encounter: Payer: Self-pay | Admitting: Neurology

## 2020-05-14 VITALS — BP 149/91 | HR 74 | Ht 70.0 in | Wt 177.0 lb

## 2020-05-14 DIAGNOSIS — G20 Parkinson's disease: Secondary | ICD-10-CM

## 2020-05-14 NOTE — Telephone Encounter (Signed)
Rx(s) sent to pharmacy electronically.  

## 2020-05-14 NOTE — Patient Instructions (Signed)
1.  Exercise 2.  Take your pramipexole, 0.5 mg three times day   The physicians and staff at [facility] are committed to providing excellent care. You may receive a survey requesting feedback about your experience at our office. We strive to receive "very good" responses to the survey questions. If you feel that your experience would prevent you from giving the office a "very good " response, please contact our office to try to remedy the situation. We may be reached at [phone]. Thank you for taking the time out of your busy day to complete the survey.

## 2020-11-15 NOTE — Progress Notes (Signed)
Assessment/Plan:   1.  Parkinsons Disease with levodopa resistant tremor  -Continue pramipexole 0.5 mg 3 times per day.  Talked to him about taking it tid  -Discussed new AAN guidelines that recommend everyone should be on levodopa.  He took it in the past and stopped it because he found that it did not help tremor.  Discussed reasons for early levodopa (and it was not to control tremor).  He would like to try.  Titration schedule given.  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  I have no objection to that.  -Understands Parkinson's slightly increases risk for melanoma.  He follows with dermatology regularly because of his history of basal cell carcinoma.  -they asked several questions and I answered to best of my ability  2.  History of hypertension  -Monitoring his blood pressure as he does have lightheadedness and on antihypertensives.  Has tried in the past to stop unsuccessfully, but wonder if medicines need decreased.   Subjective:   John Boyd was seen today in follow up for Parkinsons disease.  My previous records were reviewed prior to todays visit as well as outside records available to me.  Pt with wife who supplements the history.  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  No EDS.  Able to climb up/down out of vehicle.   Pt denies falls.  Pt with lightheadedness, but no near syncope.  No hallucinations.  Mood has been good.  Has been following with dermatology.  F/u 1 time per year.  Current prescribed movement disorder medications: Pramipexole, 0.5 mg 3 times per day (still taking bid-tid)   PREVIOUS MEDICATIONS: Sinemet (2 po tid - didn't help tremor)  ALLERGIES:   Allergies  Allergen Reactions  . Latex Rash    CURRENT MEDICATIONS:  Outpatient Encounter Medications as of 11/19/2020  Medication Sig  . allopurinol (ZYLOPRIM) 100 MG tablet Take 200  mg by mouth daily.  Marland Kitchen amoxicillin (AMOXIL) 500 MG capsule Take 4 tablet 1 hour before procedure. For dental work only  . aspirin EC 81 MG tablet Take 81 mg by mouth daily.  Tery Sanfilippo Sodium (COLACE PO) Take by mouth as needed.  . fluticasone (FLONASE) 50 MCG/ACT nasal spray Place 1 spray into the nose daily as needed. Nasal allergies  . indomethacin (INDOCIN SR) 75 MG CR capsule As needed  . lisinopril (ZESTRIL) 10 MG tablet Take 1 tablet by mouth daily.  Marland Kitchen loratadine (CLARITIN) 10 MG tablet Take 10 mg by mouth as needed for allergies.  . Omega-3 Fatty Acids (FISH OIL) 1000 MG CAPS Take by mouth.  . pramipexole (MIRAPEX) 0.5 MG tablet TAKE 1 TABLET (0.5 MG TOTAL) BY MOUTH 3 (THREE) TIMES DAILY.  . [DISCONTINUED] colchicine 0.6 MG tablet Take 0.6 mg by mouth daily. (Patient not taking: Reported on 11/19/2020)   No facility-administered encounter medications on file as of 11/19/2020.    Objective:   PHYSICAL EXAMINATION:    VITALS:   Vitals:   11/19/20 1117  BP: 110/78  Pulse: 64  SpO2: 97%  Weight: 178 lb (80.7 kg)  Height: 5\' 10"  (1.778 m)    GEN:  The patient appears stated age and is in NAD. HEENT:  Normocephalic, atraumatic.  The mucous membranes are moist. The superficial temporal arteries are without ropiness or tenderness. CV:  RRR Lungs:  CTAB Neck/HEME:  There are no  carotid bruits bilaterally.  Neurological examination:  Orientation: The patient is alert and oriented x3. Cranial nerves: There is good facial symmetry with minimal facial hypomimia. The speech is fluent and clear. Soft palate rises symmetrically and there is no tongue deviation. Hearing is intact to conversational tone. Sensation: Sensation is intact to light touch throughout Motor: Strength is at least antigravity x4.  Movement examination: Tone: There is mild increased tone in the LUE (same as last visit) Abnormal movements: there is near constant LUE rest tremor Coordination:  There is no  decremation with RAM's, with any form of RAMS, including alternating supination and pronation of the forearm, hand opening and closing, finger taps, heel taps and toe taps. Gait and Station: The patient has no difficulty arising out of a deep-seated chair without the use of the hands. The patient's stride length is good with slightly decreased arm swing on the left.    I have reviewed and interpreted the following labs independently Last labs were done November 29,2021 via [facility].  White blood cells 8.0, hemoglobin 16.7, hematocrit 50.5, platelets 252, sodium 138, potassium 4.8, chloride 100, CO2 21, BUN 17, creatinine 1.01  Total time spent on today's visit was 30 minutes, including both face-to-face time and nonface-to-face time.  Time included that spent on review of records (prior notes available to me/labs/imaging if pertinent), discussing treatment and goals, answering patient's questions and coordinating care.  Cc:  Elissa Hefty, MD

## 2020-11-19 ENCOUNTER — Other Ambulatory Visit: Payer: Self-pay

## 2020-11-19 ENCOUNTER — Encounter: Payer: Self-pay | Admitting: Neurology

## 2020-11-19 ENCOUNTER — Ambulatory Visit (INDEPENDENT_AMBULATORY_CARE_PROVIDER_SITE_OTHER): Payer: Medicare Other | Admitting: Neurology

## 2020-11-19 VITALS — BP 110/78 | HR 64 | Ht 70.0 in | Wt 178.0 lb

## 2020-11-19 DIAGNOSIS — G20 Parkinson's disease: Secondary | ICD-10-CM

## 2020-11-19 MED ORDER — CARBIDOPA-LEVODOPA 25-100 MG PO TABS: 1.0000 | ORAL_TABLET | Freq: Three times a day (TID) | ORAL | 1 refills | Status: DC

## 2020-11-19 NOTE — Patient Instructions (Signed)
1.  Start Carbidopa Levodopa as follows:  Take 1/2 tablet three times daily, at least 30 minutes before meals (approximately 7am/11am/4pm), for one week  Then take 1/2 tablet in the morning, 1/2 tablet in the afternoon, 1 tablet in the evening, at least 30 minutes before meals, for one week  Then take 1/2 tablet in the morning, 1 tablet in the afternoon, 1 tablet in the evening, at least 30 minutes before meals, for one week  Then take 1 tablet three times daily at 7am/11am/4pm, at least 30 minutes before meals   As a reminder, carbidopa/levodopa can be taken at the same time as a carbohydrate, but we like to have you take your pill either 30 minutes before a protein source or 1 hour after as protein can interfere with carbidopa/levodopa absorption.  2.  Continue pramipexole 0.5 mg three times per day.  You can take this at same time as carbidopa/levodopa 25/100

## 2021-01-26 ENCOUNTER — Other Ambulatory Visit: Payer: Self-pay | Admitting: Neurology

## 2021-03-24 ENCOUNTER — Telehealth: Payer: Self-pay | Admitting: Neurology

## 2021-03-24 NOTE — Telephone Encounter (Signed)
Patient called in to request a letter he needed before 05/15/21. 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 His appointment is scheduled on 04/28/21. He was going to request it the day of the appointment, but wanted to double check in case it will take more time.

## 2021-04-21 ENCOUNTER — Other Ambulatory Visit: Payer: Self-pay | Admitting: Neurology

## 2021-04-21 DIAGNOSIS — G20 Parkinson's disease: Secondary | ICD-10-CM

## 2021-04-24 NOTE — Progress Notes (Signed)
Assessment/Plan:   1.  Parkinsons Disease with levodopa resistant tremor  -Continue pramipexole 0.5 mg 3 times per day.  Talked to him about taking it tid  -Continue carbidopa/levodopa 25/100, 1 tablet 3 times daily.  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  I have no objection to that.  Letter written today.  -Understands Parkinson's slightly increases risk for melanoma.  He follows with dermatology regularly because of his history of basal cell carcinoma.  Its time for an appointment (been a year)   2.  History of hypertension  -Monitoring his blood pressure as he does have lightheadedness and on antihypertensives.  Has tried in the past to stop unsuccessfully, but wonder if medicines need decreased.   Subjective:   John Boyd was seen today in follow up for Parkinsons disease.  My previous records were reviewed prior to todays visit as well as outside records available to me.  Wife supplements history.  Last saw primary care June 6.  Those notes are reviewed.  Not taking allopurinol for his gout.  He has had no falls.  No lightheadedness or near syncope.  No daytime hypersomnolence.  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  No hallucinations.  No cognitive change.  Now taking care of 69 year old aunt - they are her next of kin but they have hired caregivers for her in their home.  He is not exercising.  "I stay active in the yard and re-doing a bathroom."  Current prescribed movement disorder medications: Pramipexole, 0.5 mg 3 times per day (still taking bid-tid) Carbidopa/levodopa 25/100, 1 tablet 3 times per day (started last visit again due to new AAN guidelines) - admits to missing 4 dosages per week - taking bid   PREVIOUS MEDICATIONS: Sinemet (2 po tid - didn't help tremor)  ALLERGIES:   Allergies  Allergen Reactions   Latex Rash    CURRENT MEDICATIONS:  Outpatient Encounter Medications as of  04/28/2021  Medication Sig   allopurinol (ZYLOPRIM) 100 MG tablet Take 200 mg by mouth daily.   amoxicillin (AMOXIL) 500 MG capsule Take 4 tablet 1 hour before procedure. For dental work only   aspirin EC 81 MG tablet Take 81 mg by mouth daily.   carbidopa-levodopa (SINEMET IR) 25-100 MG tablet Take 1 tablet by mouth 3 (three) times daily. 7am/11am/4pm   Docusate Sodium (COLACE PO) Take by mouth as needed.   fluticasone (FLONASE) 50 MCG/ACT nasal spray Place 1 spray into the nose daily as needed. Nasal allergies   indomethacin (INDOCIN SR) 75 MG CR capsule As needed   lisinopril (ZESTRIL) 10 MG tablet Take 1 tablet by mouth daily.   loratadine (CLARITIN) 10 MG tablet Take 10 mg by mouth as needed for allergies.   Omega-3 Fatty Acids (FISH OIL) 1000 MG CAPS Take by mouth.   pramipexole (MIRAPEX) 0.5 MG tablet TAKE 1 TABLET BY MOUTH 3 TIMES DAILY.   No facility-administered encounter medications on file as of 04/28/2021.    Objective:   PHYSICAL EXAMINATION:    VITALS:   Vitals:   04/28/21 0831  BP: 119/79  Pulse: 74  SpO2: 94%  Weight: 178 lb 12.8 oz (81.1 kg)  Height: 5\' 10"  (1.778 m)     GEN:  The patient appears stated age and is in NAD. HEENT:  Normocephalic, atraumatic.  The mucous membranes are moist. The superficial temporal arteries are without ropiness or tenderness.   Neurological  examination:  Orientation: The patient is alert and oriented x3. Cranial nerves: There is good facial symmetry with minimal facial hypomimia. The speech is fluent and clear. Soft palate rises symmetrically and there is no tongue deviation. Hearing is intact to conversational tone. Sensation: Sensation is intact to light touch throughout Motor: Strength is at least antigravity x4.  Movement examination: Tone: There is mild increased tone in the LUE (same as last several visits) Abnormal movements: there is near constant LUE rest tremor, mild Coordination:  There is no decremation with  RAM's, with any form of RAMS, including alternating supination and pronation of the forearm, hand opening and closing, finger taps, heel taps and toe taps. Gait and Station: The patient has no difficulty arising out of a deep-seated chair without the use of the hands. The patient's stride length is good with slightly decreased arm swing on the left.    I have reviewed and interpreted the following labs independently Last labs were done December 19, 2020.  Sodium was 138, potassium 4.6, chloride 102, CO2 22, BUN 17, creatinine 0.96, AST 19, ALT 10.  On November 18, 2020, white blood cells were 6.6, hemoglobin 16.1, hematocrit 48.9 and platelets 193.  Total time spent on today's visit was 20 minutes, including both face-to-face time and nonface-to-face time.  Time included that spent on review of records (prior notes available to me/labs/imaging if pertinent), discussing treatment and goals, answering patient's questions and coordinating care.  Cc:  Elissa Hefty, MD

## 2021-04-28 ENCOUNTER — Encounter: Payer: Self-pay | Admitting: Neurology

## 2021-04-28 ENCOUNTER — Ambulatory Visit (INDEPENDENT_AMBULATORY_CARE_PROVIDER_SITE_OTHER): Payer: Medicare Other | Admitting: Neurology

## 2021-04-28 ENCOUNTER — Other Ambulatory Visit: Payer: Self-pay

## 2021-04-28 VITALS — BP 119/79 | HR 74 | Ht 70.0 in | Wt 178.8 lb

## 2021-04-28 DIAGNOSIS — G20 Parkinson's disease: Secondary | ICD-10-CM | POA: Diagnosis not present

## 2021-04-28 NOTE — Patient Instructions (Signed)
Online Resources for Power over Parkinson's Group November 2022  Local [place] Online Groups  Power over Parkinson's Group :   Power Over Parkinson's Patient Education Group will be Wednesday, November 9th-*Hybrid meting*- in person at [place] location and via WEBEX at 2:00 pm.   Upcoming Power over Parkinson's Meetings:  2nd Wednesdays of the month at 2 pm:  November 9th, December 14th Contact Amy Marriott at [email or web address] if interested in participating in this online group Parkinson's Care Partners Group:    3rd Mondays, Contact Misty Paladino Atypical Parkinsonian Patient Group:   4th Wednesdays, Contact Misty Paladino If you are interested in participating in these online groups with Misty, please contact her directly for how to join those meetings.  Her contact information is [email or web address].   Parkinson Foundation:  www.[email or web address].org PD Health at Home continues:  Mindfulness Mondays, Expert Briefing Tuesdays, Wellness Wednesdays, Take Time Thursdays, Fitness Fridays -Listings for June 2022 are on the website Upcoming Webinar:  Expert Briefing:  Let's Talk about Dementia.  Wednesday, November 2nd  at 1 pm. Register for expert briefings (webinars) at [email or web address]://www.[email or web address].org/resources-support/online-education/expert-briefings-webinars  Please check out their website to sign up for emails and see their full online offerings  Michael [employer] Foundation:  www.michaeljfox.org  Upcoming Webinar:   2022 in Review:  Progress Toward Better Treatment and Prevention.  Thursday, November 17th at 12 noon Check out additional information on their website to see their full online offerings  Davis Phinney Foundation:  www.davisphinneyfoundation.org Upcoming Webinar:  NOH (Neurogenic Orthostatic Hypotension):  What it is, How to Know if you Have it, and How to Manage it.  Tuesday, November 15th at 3 pm.  Webinar Series:  Living with Parkinson's Meetup.    Third Thursdays of each month at 3 pm; next one is November 17th Care Partner Monthly Meetup.  With Connie Carpenter Phinney.  First Tuesday of each month, 2 pm Joy Breaks:  First Wednesday of each month, 2-3 pm. There will be art, doodling, making, crafting, listening, laughing, stories, and everything in between. No art experience necessary. No supplies required. Just show up for joy!  Register on their website. Check out additional information to Live Well Today on their website  Parkinson and Movement Disorders (PMD) Alliance:  www.pmdalliance.org [employer]Life Online:  Online Education Events Sign up for emails, which are sent weekly to give you updates on programming and online offerings  Parkinson's Association of [place]:  www.parkinsonassociation.org Information on online support groups, education events, and online exercises including Yoga, Parkinson's exercises and more-LOTS of information on links to PD resources and online events Virtual Support Group through Parkinson's Association of [place]; next one is scheduled for Wednesday, November 2nd  at 2 pm.  (These are typically scheduled for the 1st Wednesday of the month at 2 pm).  Visit website for details.  Additional links for movement activities: Parkinson's DRUMMING Classes/Music Therapy with Jane Maydian:  This is a returning class and it's FREE!  2nd Mondays, continuing November 14th.  Contact *Misty Taylor-Paladino at [email or web address] or Jane Maydian at [phone] or [email or web address]  PWR! Moves Classes at [facility].  Wednesdays 10 and 11 am.  Contact Amy Marriott, PT [email or web address] if interested. Here is a link to the PWR!Moves classes on Zoom from [place] Parkinson's Foundation - Daily Mon-Sat at 10:00. Via Zoom, FREE and open to all.  There is also a link below via Facebook if you use that  platform. https://www.parkinsonsmi.org/mpf-programs/exercise-and-movement-activities https://www.facebook.com/ParkinsonsMI.org/posts/pwr-moves-exercise-class-parkinson-wellness-recovery-online-with-angee-ludwa-pt-/10156827878021813/ Parkinson's Wellness Recovery (PWR! Moves)  www.pwr4life.org Info on   the PWR! Virtual Experience:  You will have access to our expertise through self-assessment, guided plans that start with the PD-specific fundamentals, educational content, tips, Q&A with an expert, and a growing library of PD-specific pre-recorded and live exercise classes of varying types and intensity - both physical and cognitive! If that is not enough, we offer 1:1 wellness consultations (in-person or virtual) to personalize your PWR! Virtual Experience.  Parkinson Foundation Fitness Fridays:  As part of the PD Health @ Home program, this free video series focuses each week on one aspect of fitness designed to support people living with Parkinson's.  These weekly videos highlight the Parkinson Foundation recent fitness guidelines for people with Parkinson's disease.  www.parkinson.org/understanding-parkinsons/coronavirus/PD-health-at-home/Fitness-Fridays Dance for PD website is offering free, live-stream classes throughout the week, as well as links to digital library of classes:  https://danceforparkinsons.org/ Dance for Parkinson's Class:  [employer] [place].  Free offering for people with Parkinson's and care partners; virtual class.  For more information, contact [phone] or email Magalli Morana at [email or web address] Virtual dance and Pilates for Parkinson's classes: Click on the Community Tab> Parkinson's Movement Initiative Tab.  To register for classes and for more information, visit www.americandancefestival.org and click the "community" tab.  [employer] Parkinson's Cycling Classes  [employer]: 1pm on Fridays-Live classes at [employer] (Contact Margaret Hazen at  [email or web address] or [phone]) [employer]: Virtual Classes Mondays and Thursdays /Live classes Tuesday, Wednesday and Thursday (contact Marlee at [email or web address]  or [phone]) [place] [employer] Varied levels of classes are offered Mondays, Tuesdays and Thursdays at [facility] [facility].  To observe a class or for more information, call [phone] or email [email or web address] [employer]: Online Caregiver Education Opportunities:  www.well-springsolutions.org/caregiver-education/caregiver-support-group.  You may also contact Jodi Kolada at [email or web address] or [phone].   *Multiple opportunities below, as November is Caregiver Month!* A Guide to Physical and Mental Fitness for Family Caregivers.  Wednesday, November 9th, 12:30-2 pm at [employer] Taking Care of You!  Tuesday, November 15th, 11:30-12:45.  [facility], [address] Understanding Care Options and Advanced Care Planning.  Monday, November 21st, 4:30-5:45.  [facility], [place] Contact Jodi Kolada above to register. [facility] Navigator:  Just1Navigator program, a free service to help individuals and families through the journey of determining care for older adults.  The "Navigator" is a social worker, Nicole Reynolds, who will speak with a prospective client and/or loved ones to provide an assessment of the situation and a set of recommendations for a personalized care plan -- all free of charge, and whether [facility] [employer] offers the needed service or not. If the need is not a service we provide, we are well-connected with reputable programs in town that we can refer you to.  www.well-springsolutions.org or to speak with the Navigator, call [phone].  

## 2021-05-23 ENCOUNTER — Ambulatory Visit: Payer: Medicare Other | Admitting: Neurology

## 2021-06-01 ENCOUNTER — Other Ambulatory Visit: Payer: Self-pay | Admitting: Neurology

## 2021-06-12 ENCOUNTER — Telehealth: Payer: Self-pay | Admitting: Cardiology

## 2021-06-12 MED ORDER — AMOXICILLIN 500 MG PO CAPS
ORAL_CAPSULE | ORAL | 1 refills | Status: AC
Start: 2021-06-12 — End: ?

## 2021-06-12 NOTE — Telephone Encounter (Signed)
°*  STAT* If patient is at the pharmacy, call can be transferred to refill team.   1. Which medications need to be refilled? (please list name of each medication and dose if known) amoxicillin (AMOXIL) 500 MG capsule  2. Which pharmacy/location (including street and city if local pharmacy) is medication to be sent to? [id number] - [place], [place] - [address]  3. Do they need a 30 day or 90 day supply? PT IS PREPARING FOR A DENTAL PROCEDURE ON 06/23/21 AND HE NEEDS THIS MEDICINE

## 2021-06-12 NOTE — Telephone Encounter (Signed)
REQUESTED RX SENT TO REQUESTED PHARMACY. Left detailed message

## 2021-08-22 ENCOUNTER — Other Ambulatory Visit: Payer: Self-pay | Admitting: Neurology

## 2021-08-22 DIAGNOSIS — G20 Parkinson's disease: Secondary | ICD-10-CM

## 2021-11-21 NOTE — Progress Notes (Unsigned)
Assessment/Plan:   1.  Parkinsons Disease with levodopa resistant tremor  -Continue pramipexole 0.5 mg 3 times per day.  Missing some middle of the day dosages but doing a bit better.  He understands that I want him taking his pramipexole and levodopa 3 times per day and not twice per day.  -Continue carbidopa/levodopa 25/100, 1 tablet 3 times daily.  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  I have no objection to that.  He is going to call me when next letter due as he thinks it will be due before next visit  -Understands Parkinson's slightly increases risk for melanoma.  He follows with dermatology regularly because of his history of basal cell carcinoma.  Its time for an appointment (been a year)   2.  History of hypertension  -Monitoring his blood pressure as he does have lightheadedness and on antihypertensives.  Has tried in the past to stop unsuccessfully, but wonder if medicines need decreased.   Subjective:   John Boyd was seen today in follow up for Parkinsons disease.  My previous records were reviewed prior to todays visit as well as outside records available to me.   Saw primary care December 8.  Those notes are reviewed.  Pt denies falls.  Pt denies lightheadedness, near syncope.  No hallucinations.  Mood has been good.  Last visit, reviewed the importance of taking medication 3 times per day, as opposed to twice per day.  He admits that he often misses dosages and takes it twice per day but he states that he is doing better the last month or two.  He is working [employer]ician, dispensing and doing well with that.  He is exercising on the treadmill some but he hurt his L knee and backed off some.  He is still able to make pens.    Current prescribed movement disorder medications: Pramipexole, 0.5 mg 3 times per day (still taking bid-tid) Carbidopa/levodopa 25/100, 1 tablet 3 times per day    PREVIOUS MEDICATIONS: Sinemet (2 po  tid - didn't help tremor)  ALLERGIES:   Allergies  Allergen Reactions   Latex Rash    CURRENT MEDICATIONS:  Outpatient Encounter Medications as of 11/24/2021  Medication Sig   allopurinol (ZYLOPRIM) 100 MG tablet Take 200 mg by mouth daily.   amoxicillin (AMOXIL) 500 MG capsule Take 4 tablet 1 hour before procedure. For dental prophylaxis only   aspirin EC 81 MG tablet Take 81 mg by mouth daily.   carbidopa-levodopa (SINEMET IR) 25-100 MG tablet TAKE 1 TABLET BY MOUTH 3 (THREE) TIMES DAILY. 7AM/11AM/4PM   fluticasone (FLONASE) 50 MCG/ACT nasal spray Place 1 spray into the nose daily as needed. Nasal allergies   indomethacin (INDOCIN SR) 75 MG CR capsule As needed   lisinopril (ZESTRIL) 10 MG tablet Take 1 tablet by mouth daily.   loratadine (CLARITIN) 10 MG tablet Take 10 mg by mouth as needed for allergies.   pramipexole (MIRAPEX) 0.5 MG tablet TAKE 1 TABLET BY MOUTH THREE TIMES A DAY   [DISCONTINUED] Docusate Sodium (COLACE PO) Take by mouth as needed.   [DISCONTINUED] Omega-3 Fatty Acids (FISH OIL) 1000 MG CAPS Take by mouth.   No facility-administered encounter medications on file as of 11/24/2021.    Objective:   PHYSICAL EXAMINATION:    VITALS:   Vitals:   11/24/21 1355  BP: 124/78  Pulse: 74  SpO2: 98%  Weight: 183 lb (83 kg)  Height: 5'  10" (1.778 m)      GEN:  The patient appears stated age and is in NAD. HEENT:  Normocephalic, atraumatic.  The mucous membranes are moist. The superficial temporal arteries are without ropiness or tenderness. CV:  RRR Lungs:  CTAB Neck:  no bruits   Neurological examination:  Orientation: The patient is alert and oriented x3. Cranial nerves: There is good facial symmetry with minimal facial hypomimia. The speech is fluent and clear. Soft palate rises symmetrically and there is no tongue deviation. Hearing is intact to conversational tone. Sensation: Sensation is intact to light touch throughout Motor: Strength is at least  antigravity x4.  Movement examination: Tone: There is mild increased tone in the LUE (same as last several visits) Abnormal movements: there is near constant LUE rest tremor, mild Coordination:  There is no decremation with RAM's, with any form of RAMS, including alternating supination and pronation of the forearm, hand opening and closing, finger taps, heel taps and toe taps. Gait and Station: The patient has no difficulty arising out of a deep-seated chair without the use of the hands. The patient's stride length is good with slightly decreased arm swing on the left (same as last visit)  I have reviewed and interpreted the following labs independently Last labs were done December 19, 2020.  Sodium was 138, potassium 4.6, chloride 102, CO2 22, BUN 17, creatinine 0.96, AST 19, ALT 10.  On November 18, 2020, white blood cells were 6.6, hemoglobin 16.1, hematocrit 48.9 and platelets 193.  Total time spent on today's visit was 20 minutes, including both face-to-face time and nonface-to-face time.  Time included that spent on review of records (prior notes available to me/labs/imaging if pertinent), discussing treatment and goals, answering patient's questions and coordinating care.  Cc:  Elissa Hefty, MD

## 2021-11-22 ENCOUNTER — Other Ambulatory Visit: Payer: Self-pay | Admitting: Neurology

## 2021-11-22 DIAGNOSIS — G20 Parkinson's disease: Secondary | ICD-10-CM

## 2021-11-24 ENCOUNTER — Ambulatory Visit (INDEPENDENT_AMBULATORY_CARE_PROVIDER_SITE_OTHER): Payer: Medicare Other | Admitting: Neurology

## 2021-11-24 ENCOUNTER — Encounter: Payer: Self-pay | Admitting: Neurology

## 2021-11-24 VITALS — BP 124/78 | HR 74 | Ht 70.0 in | Wt 183.0 lb

## 2021-11-24 DIAGNOSIS — G20 Parkinson's disease: Secondary | ICD-10-CM | POA: Diagnosis not present

## 2021-11-24 NOTE — Patient Instructions (Signed)
Local and Online Resources for Power over Parkinson's Group June 2023  LOCAL [place] PARKINSON'S GROUPS  Power over Parkinson's Group:   Power Over Parkinson's Patient Education Group will be Wednesday, June 14th-*Hybrid meting*- in person at [place] [facility] location and via WEBEX at 2:00 pm.   Upcoming Power over Parkinson's Meetings:  2nd Wednesdays of the month at 2 pm:   June 14th, July 12th Contact Amy Marriott at [email or web address] if interested in participating in this group Parkinson's Care Partners Group:    3rd Mondays, Contact Misty Paladino Atypical Parkinsonian Patient Group:   4th Wednesdays, Contact Misty Paladino If you are interested in participating in these groups with Misty, please contact her directly for how to join those meetings.  Her contact information is [email or web address].    LOCAL EVENTS AND NEW OFFERINGS Dance Class for People with Parkinson's at [employer].  Friday, June 9th at 2 pm.  Led by [employer] DPT students.  Contact [email or web address] to register or with questions. Ice Cream Social at [employer]!  Thursday, June 15th, 5:30-7:00 pm.  RSVP to [email or web address] for attendance and free ice cream. Parkinson's T-shirts for sale!  Designed by a local group member, with funds going to Movement Disorders Fund.  $25.00  Contact Misty to purchase  New PWR! Moves Community Fitness Instructor-Led Class offering at [employer]!  Wednesdays 1-2 pm, starting April 12th.   Contact Susan Laney, Fitness Manager at [employer].  [email or web address]  ONLINE EDUCATION AND SUPPORT Parkinson Foundation:  www.parkinson.org PD Health at Home continues:  Mindfulness Mondays, Wellness Wednesdays, Fitness Fridays  Upcoming Education: Parkinson's 101:  What You and Your Family Should Know.  Wednesday, June 7th at 1:00 pm Register for expert briefings (webinars) at  https://www.[email or web address].org/resources-support/online-education/expert-briefings-webinars Please check out their website to sign up for emails and see their full online offerings   [employer] Foundation:  www.michaeljfox.org  Third Thursday Webinars:  On the third Thursday of every month at 12 p.m. ET, join our free live webinars to learn about various aspects of living with Parkinson's disease and our work to speed medical breakthroughs. Upcoming Webinar: REPLAY:  From Low Blood Pressure to Bladder Problems:  A Look at Lesser Known Parkinson's Symptoms.  Thursday, June 15th at 12 noon. Check out additional information on their website to see their full online offerings  [employer] Phin[employer] Foundation:  www.davisphinneyfoundation.org Upcoming Webinar:   Stay tuned Webinar Series:  Living with Parkinson's Meetup.   Third Thursdays each month, 3 pm Care Partner Monthly Meetup.  With Connie Carpenter Phinney.  First Tuesday of each month, 2 pm Check out additional information to Live Well Today on their website  Parkinson and Movement Disorders (PMD) Alliance:  www.pmdalliance.org [employer] Online:  Online Education Events Sign up for emails, which are sent weekly to give you updates on programming and online offerings  Parkinson's Association of [place]:  www.parkinsonassociation.org Information on online support groups, education events, and online exercises including Yoga, Parkinson's exercises and more-LOTS of information on links to PD resources and online events Virtual Support Group through Parkinson's Association of [place]; next one is scheduled for Wednesday, June 7th at 2 pm. (These are typically scheduled for the 1st Wednesday of the month at 2 pm).  Visit website for details. Save the date for "Caring for Parkinson's-Caring for You", 9th Annual Symposium.  In-person event in [place].  September 9th.  More info on registration to come. MOVEMENT AND EXERCISE OPPORTUNITIES PWR!  Moves Classes at [facility].  Wednesdays 10 and 11   am.   Contact Amy Marriott, PT [email or web address] if interested. NEW PWR! Moves Class offering at [employer].  Wednesdays 1-2 pm, starting April 12th.  Contact Susan Laney, Fitness Manager at [employer].  [email or web address] Here is a link to the PWR!Moves classes on Zoom from [place] Parkinson's Foundation - Daily Mon-Sat at 10:00. Via Zoom, FREE and open to all.  There is also a link below via Facebook if you use that platform.  https://www.parkinsonsmi.org/mpf-programs/exercise-and-movement-activities https://www.facebook.com/ParkinsonsMI.org/posts/pwr-moves-exercise-class-parkinson-wellness-recovery-online-with-angee-ludwa-pt-/10156827878021813/  Parkinson's Wellness Recovery (PWR! Moves)  www.pwr4life.org Info on the PWR! Virtual Experience:  You will have access to our expertise through self-assessment, guided plans that start with the PD-specific fundamentals, educational content, tips, Q&A with an expert, and a growing library of PD-specific pre-recorded and live exercise classes of varying types and intensity - both physical and cognitive! If that is not enough, we offer 1:1 wellness consultations (in-person or virtual) to personalize your PWR! Virtual Experience.  Parkinson Foundation Fitness Fridays:  As part of the PD Health @ Home program, this free video series focuses each week on one aspect of fitness designed to support people living with Parkinson's.  These weekly videos highlight the Parkinson Foundation recent fitness guidelines for people with Parkinson's disease. www.parkinson.org/resources-support/online-education/pdhealth#ff Dance for PD website is offering free, live-stream classes throughout the week, as well as links to digital library of classes:  https://danceforparkinsons.org/ Virtual dance and Pilates for Parkinson's classes: Click on the Community Tab> Parkinson's Movement Initiative  Tab.  To register for classes and for more information, visit www.americandancefestival.org and click the "community" tab.  [employer] Parkinson's Cycling Classes  [employer]:  Thursdays @ Noon-Live classes at [employer] (Contact Margaret Hazen at [email or web address] or [phone]) [employer]: Virtual Classes Mondays and Thursdays /Live classes Tuesday, Wednesday and Thursday (contact Marlee at [email or web address]  or [phone]) [place] [employer] Boxing Varied levels of classes are offered Tuesdays and Thursdays at [employer] [facility] [facility].  Stretching with Maria weekly class is also offered for people with Parkinson's To observe a class or for more information, call [phone] or email Hillary Savage at [email or web address] ADDITIONAL SUPPORT AND RESOURCES [facility] [employer]:Online Caregiver Education Opportunities:  www.well-springsolutions.org/caregiver-education/caregiver-support-group.  You may also contact Jodi Kolada at [email or web address] or [phone].    [facility] Navigator:  Just1Navigator program, a free service to help individuals and families through the journey of determining care for older adults.  The "Navigator" is a social worker, Nicole Reynolds, who will speak with a prospective client and/or loved ones to provide an assessment of the situation and a set of recommendations for a personalized care plan -- all free of charge, and whether [facility] offers the needed service or not. If the need is not a service we provide, we are well-connected with reputable programs in town that we can refer you to.  www.well-springsolutions.org or to speak with the Navigator, call [phone]. Family Caregiver Programming in June:  [employer], Thursday, June 15th 11-12:30 at [employer], [place].  Call [phone] to register  

## 2022-02-14 ENCOUNTER — Other Ambulatory Visit: Payer: Self-pay | Admitting: Neurology

## 2022-05-04 ENCOUNTER — Encounter: Payer: Self-pay | Admitting: Neurology

## 2022-05-12 ENCOUNTER — Other Ambulatory Visit: Payer: Self-pay | Admitting: Neurology

## 2022-05-12 DIAGNOSIS — G20A1 Parkinson's disease without dyskinesia, without mention of fluctuations: Secondary | ICD-10-CM

## 2022-05-21 ENCOUNTER — Other Ambulatory Visit: Payer: Self-pay | Admitting: Neurology

## 2022-05-29 NOTE — Progress Notes (Unsigned)
Assessment/Plan:   1.  Parkinsons Disease with levodopa resistant tremor  -Continue pramipexole 0.5 mg 3 times per day.  Missing some middle of the day dosages but doing a bit better.  He understands that I want him taking his pramipexole and levodopa 3 times per day and not twice per day.  -Continue carbidopa/levodopa 25/100, 1 tablet 3 times daily.  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  I have no objection to that.  I have no objection to it from a pure Parkinsons Disease standpoint  -Understands Parkinson's slightly increases risk for melanoma.  He has had a BCC in the past but hasn't been in several years.  Encouraged him to get an appt.   2.  History of hypertension  -Monitoring his blood pressure as he does have lightheadedness and on antihypertensives.  Has tried in the past to stop unsuccessfully, but wonder if medicines need decreased.   Subjective:   John Boyd was seen today in follow up for Parkinsons disease.  My previous records were reviewed prior to todays visit as well as outside records available to me.   Saw primary care December 11 for COVID.  He was started on Paxlovid.  He is much better than he was last week.  From a Parkinson's standpoint, the patient continues to do well.  He is on pramipexole and levodopa.  He has no compulsive behaviors or hallucinations.  He has no sleep attacks and is wide-awake during the day.  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  He did fall 3 times in June.  He went to summer camp.  With one fall he was diving for a ball and a kid dived in front of him and he tried not to fall on the kid and fell.  With one fall, he was trying to go to the dining hall and it was raining and he fell.  With the last fall, he tripped on a stick.    Current prescribed movement disorder medications: Pramipexole, 0.5 mg 3 times per day (still taking bid-tid) Carbidopa/levodopa 25/100, 1  tablet 3 times per day    PREVIOUS MEDICATIONS: Sinemet (2 po tid - didn't help tremor)  ALLERGIES:   Allergies  Allergen Reactions   Latex Rash    CURRENT MEDICATIONS:  Outpatient Encounter Medications as of 06/02/2022  Medication Sig   allopurinol (ZYLOPRIM) 100 MG tablet Take 200 mg by mouth daily.   amoxicillin (AMOXIL) 500 MG capsule Take 4 tablet 1 hour before procedure. For dental prophylaxis only   aspirin EC 81 MG tablet Take 81 mg by mouth daily.   carbidopa-levodopa (SINEMET IR) 25-100 MG tablet TAKE 1 TABLET BY MOUTH 3 (THREE) TIMES DAILY. 7AM/11AM/4PM   fluticasone (FLONASE) 50 MCG/ACT nasal spray Place 1 spray into the nose daily as needed. Nasal allergies   indomethacin (INDOCIN SR) 75 MG CR capsule As needed   lisinopril (ZESTRIL) 10 MG tablet Take 1 tablet by mouth daily.   loratadine (CLARITIN) 10 MG tablet Take 10 mg by mouth as needed for allergies.   pramipexole (MIRAPEX) 0.5 MG tablet TAKE 1 TABLET BY MOUTH THREE TIMES A DAY   rosuvastatin (CRESTOR) 5 MG tablet Take 5 mg by mouth daily.   No facility-administered encounter medications on file as of 06/02/2022.    Objective:   PHYSICAL EXAMINATION:    VITALS:   Vitals:   06/02/22 0920  BP: 116/78  Pulse: 72  SpO2: 99%  Weight: 177 lb (80.3 kg)  Height: 5\' 10"  (1.778 m)       GEN:  The patient appears stated age and is in NAD. HEENT:  Normocephalic, atraumatic.  The mucous membranes are moist. The superficial temporal arteries are without ropiness or tenderness. CV:  RRR Lungs:  CTAB Neck:  no bruits   Neurological examination:  Orientation: The patient is alert and oriented x3. Cranial nerves: There is good facial symmetry with minimal facial hypomimia. The speech is fluent and clear. Soft palate rises symmetrically and there is no tongue deviation. Hearing is intact to conversational tone. Sensation: Sensation is intact to light touch throughout Motor: Strength is at least antigravity  x4.  Movement examination: Tone: There is mild increased tone in the LUE (same as last several visits) Abnormal movements: there is near constant LUE rest tremor, mild Coordination:  There is no decremation with RAM's, with any form of RAMS, including alternating supination and pronation of the forearm, hand opening and closing, finger taps, heel taps and toe taps. Gait and Station: The patient has no difficulty arising out of a deep-seated chair without the use of the hands. The patient's stride length is good with slightly decreased arm swing on the left (same as last visit)     Cc:  , MD

## 2022-06-02 ENCOUNTER — Ambulatory Visit (INDEPENDENT_AMBULATORY_CARE_PROVIDER_SITE_OTHER): Payer: Medicare Other | Admitting: Neurology

## 2022-06-02 ENCOUNTER — Encounter: Payer: Self-pay | Admitting: Neurology

## 2022-06-02 VITALS — BP 116/78 | HR 72 | Ht 70.0 in | Wt 177.0 lb

## 2022-06-02 DIAGNOSIS — G20A1 Parkinson's disease without dyskinesia, without mention of fluctuations: Secondary | ICD-10-CM | POA: Insufficient documentation

## 2022-06-02 MED ORDER — CARBIDOPA-LEVODOPA 25-100 MG PO TABS: 1.0000 | ORAL_TABLET | Freq: Three times a day (TID) | ORAL | 2 refills | Status: DC

## 2022-06-02 MED ORDER — PRAMIPEXOLE DIHYDROCHLORIDE 0.5 MG PO TABS: 0.5000 mg | ORAL_TABLET | Freq: Three times a day (TID) | ORAL | 2 refills | Status: DC

## 2022-06-02 NOTE — Patient Instructions (Signed)
Get your dermatology appointment as Parkinsons Disease increases risk of melanoma.  Local and Online Resources for Power over Parkinson's Group  December 2023    LOCAL [place] PARKINSON'S GROUPS   Power over Parkinson's Group:    Power Over Parkinson's Patient Education Group will be Wednesday, December 13th-*Hybrid meting*- in person at [facility] location and via [facility], 2:00-3:00 pm.   Starting in November, Power over [employer] and Care Partner Groups will meet together, with plans for separate break out session for caregivers (*this will be evolving over the next few months) Upcoming Power over Parkinson's Meetings/Care Partner Support:  2nd Wednesdays of the month at 2 pm:   December 13th, January 10th  [place] at [email or web address] if interested in participating in this group    [place] Party!  Wednesday, December 6th, 4:00-5:00 pm.  [facility] and Fitness.  RSVP to Garnetta Buddy at [phone] or [email or web address] New PWR! Moves [employer] Instructor-Led Classes offering at [employer]!  TUESDAYS and Wednesdays 1-2 pm.   Contact Vonna Kotyk at  [email or web address]  or [phone] (Tuesday classes are modified for chair and standing only) Dance for Parkinson 's classes will be on Tuesdays 9:30am-10:30am starting October 3-December 12 with a break the week of November 21st. Located in the [employer] , in the first floor of the [employer] ([place] To register:  [email or web address] or [phone]  Drumming for Parkinson's will be held on 2nd and 4th Mondays at 11:00 am.   Located at the [place] ([place]  [place] at [email or web address] or [phone]  Through support from the [place] for Parkinson's classes are free  for both patients and caregivers.    [employer] Parkinson's Tai Chi Class, Mondays at 11 am.  Call [phone] for details   [place]:  [email or web address]  PD Health at Home continues:  Mindfulness Mondays, Wellness Wednesdays, Fitness Fridays   Upcoming Education:    Eating and Feeling Well through the [place]. Wednesday, Dec. 6th,  1-2 pm  Hospital Safety.  Wednesday, Dec. 13th, 1-2 pm Register for expert briefings (webinars) at [email or web address]  Please check out their website to sign up for emails and see their full online offerings      [place]:  [email or web address]   Third Thursday Webinars:  On the third Thursday of every month at 12 p.m. ET, join our free live webinars to learn about various aspects of living with Parkinson's disease and our work to speed medical breakthroughs.  Upcoming Webinar:  Tools for Diagnosing and Visualizing Parkinson's Disease.  Thursday, December 21st at 12 noon. Check out additional information on their website to see their full online offerings    [facility]:  [email or web address]  Upcoming Webinar:   Stay tuned  Webinar Series:  Living with Parkinson's Meetup.   Third Thursdays each month, 3 pm  Care Partner Monthly Meetup.  With Robin Searing Phinney.  First Tuesday of each month, 2 pm  Check out additional information to Live Well Today on their website    Parkinson and Movement Disorders (PMD) Alliance:  www.[email or web address].org  [employer] Online:  Online Education Events  Sign up for emails, which are sent weekly to give you updates on programming and online offerings    Parkinson's Association of the [place]:  www.parkinsonassociation.org  Information on online support groups, education events,  and online exercises including Yoga, Parkinson's exercises and more-LOTS of information on links to  PD resources and online events  Virtual Support Group through Parkinson's Association of the [place]; next one is scheduled for Wednesday, December 6th  at 2 pm.  (These are typically scheduled for the 1st Wednesday of the month at 2 pm).  Visit website for details.   MOVEMENT AND EXERCISE OPPORTUNITIES  PWR! Moves Classes at [place].  Wednesdays 10 and 11 am.   Contact Amy Marriott, PT [email or web address] if interested.  NEW PWR! Moves Class offerings at [employer].  *TUESDAYS* and Wednesdays 1-2 pm.    Contact Vonna Kotyk at  [email or web address]    Parkinson's Wellness Recovery (PWR! Moves)  www.[email or web address]  Info on the PWR! Virtual Experience:  You will have access to our expertise?through self-assessment, guided plans that start with the PD-specific fundamentals, educational content, tips, Q&A with an expert, and a growing Art therapist of PD-specific pre-recorded and live exercise classes of varying types and intensity - both physical and cognitive! If that is not enough, we offer 1:1 wellness consultations (in-person or virtual) to personalize your PWR! Research scientist (medical).   [place] Fridays:   As part of the PD Health @ Home program, this free video series focuses each week on one aspect of fitness designed to support people living with Parkinson's.? These weekly videos highlight the [place] fitness guidelines for people with Parkinson's disease.  [email or web address]   Dance for PD website is offering free, live-stream classes throughout the week, as well as links to [employer] of classes:  [email or web address].[email or web address]  Virtual dance and Pilates for Parkinson's classes: Click on the Community Tab> Parkinson's Movement Initiative Tab.  To register for classes and for more information, visit [email or web address] and click the "community" tab.   [employer] Parkinson's  Cycling Classes   [employer]:  Thursdays @ Noon-Live classes at [employer] ([employer] at [email or web address]?or [phone])  [facility] [employer]: Virtual Classes Mondays and Thursdays Jeanette Caprice classes Tuesday, Wednesday and Thursday (contact [place] at [email or web address] ?or [phone])  [place]  Varied levels of classes are offered Tuesdays and Thursdays at [employer].   Stretching with Verdis Frederickson weekly class is also offered for people with Parkinson's  To observe a class or for more information, call [phone] or email Hezzie Bump at [email or web address]   ADDITIONAL SUPPORT AND RESOURCES  Well-Spring Solutions:Online Caregiver Education Opportunities:  www.well-springsolutions.org/caregiver-education/caregiver-support-group.  You may also contact Vickki Muff at [email or web address] or [phone].     Well-Spring Navigator:  Just1Navigator program, a?free service to help individuals and families through the journey of determining care for older adults.  The "Navigator" is a Education officer, museum, Arnell Asal, who will speak with a prospective client and/or loved ones to provide an assessment of the situation and a set of recommendations for a personalized care plan -- all free of charge, and whether?Well-Spring Solutions offers the needed service or not. If the need is not a service we provide, we are well-connected with reputable programs in town that we can refer you to.  www.well-springsolutions.org or to speak with the Navigator, call [phone].

## 2022-12-02 ENCOUNTER — Ambulatory Visit: Payer: Medicare Other | Admitting: Neurology

## 2022-12-21 NOTE — Progress Notes (Unsigned)
Assessment/Plan:   1.  Parkinsons Disease with levodopa resistant tremor  -Continue pramipexole 0.5 mg 3 times per day.  Missing some middle of the day dosages but doing a bit better.  He understands that I want him taking his pramipexole and levodopa 3 times per day and not twice per day.  -Continue carbidopa/levodopa 25/100, 1 tablet 3 times daily.  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  I have no objection to that.  I have no objection to it from a pure Parkinsons Disease standpoint  -Understands Parkinson's slightly increases risk for melanoma.  He has had a BCC in the past but hasn't been in several years.  Encouraged him to get an appt.   2.  History of hypertension  -Monitoring his blood pressure as he does have lightheadedness and on antihypertensives.  Has tried in the past to stop unsuccessfully, but wonder if medicines need decreased.   Subjective:   John Boyd was seen today in follow up for Parkinsons disease.  My previous records were reviewed prior to todays visit as well as outside records available to me.   Pt had a fall at a church retreat on June 21.  He sat in a broken rocking chair that ended up tipping backwards.  He ended up following up with primary care because he developed some neck and back pain.  He had a CT of the cervical spine on June 28 which fortunately was nonacute.  Medical records from his primary care reviewed.  Pt denies lightheadedness, near syncope.  No hallucinations.  Mood has been good.   Current prescribed movement disorder medications: Pramipexole, 0.5 mg 3 times per day (still taking bid-tid) Carbidopa/levodopa 25/100, 1 tablet 3 times per day    PREVIOUS MEDICATIONS: Sinemet (2 po tid - didn't help tremor)  ALLERGIES:   Allergies  Allergen Reactions   Latex Rash    CURRENT MEDICATIONS:  Outpatient Encounter Medications as of 06/02/2022  Medication Sig   allopurinol (ZYLOPRIM)  100 MG tablet Take 200 mg by mouth daily.   amoxicillin (AMOXIL) 500 MG capsule Take 4 tablet 1 hour before procedure. For dental prophylaxis only   aspirin EC 81 MG tablet Take 81 mg by mouth daily.   carbidopa-levodopa (SINEMET IR) 25-100 MG tablet TAKE 1 TABLET BY MOUTH 3 (THREE) TIMES DAILY. 7AM/11AM/4PM   fluticasone (FLONASE) 50 MCG/ACT nasal spray Place 1 spray into the nose daily as needed. Nasal allergies   indomethacin (INDOCIN SR) 75 MG CR capsule As needed   lisinopril (ZESTRIL) 10 MG tablet Take 1 tablet by mouth daily.   loratadine (CLARITIN) 10 MG tablet Take 10 mg by mouth as needed for allergies.   pramipexole (MIRAPEX) 0.5 MG tablet TAKE 1 TABLET BY MOUTH THREE TIMES A DAY   rosuvastatin (CRESTOR) 5 MG tablet Take 5 mg by mouth daily.   No facility-administered encounter medications on file as of 06/02/2022.    Objective:   PHYSICAL EXAMINATION:    VITALS:   Vitals:   06/02/22 0920  BP: 116/78  Pulse: 72  SpO2: 99%  Weight: 177 lb (80.3 kg)  Height: 5\' 10"  (1.778 m)    GEN:  The patient appears stated age and is in NAD. HEENT:  Normocephalic, atraumatic.  The mucous membranes are moist. The superficial temporal arteries are without ropiness or tenderness. CV:  RRR Lungs:  CTAB Neck:  no bruits   Neurological examination:  Orientation: The patient is  alert and oriented x3. Cranial nerves: There is good facial symmetry with minimal facial hypomimia. The speech is fluent and clear. Soft palate rises symmetrically and there is no tongue deviation. Hearing is intact to conversational tone. Sensation: Sensation is intact to light touch throughout Motor: Strength is at least antigravity x4.  Movement examination: Tone: There is mild increased tone in the LUE (same as last several visits) Abnormal movements: there is near constant LUE rest tremor, mild Coordination:  There is no decremation with RAM's, with any form of RAMS, including alternating supination and  pronation of the forearm, hand opening and closing, finger taps, heel taps and toe taps. Gait and Station: The patient has no difficulty arising out of a deep-seated chair without the use of the hands. The patient's stride length is good with slightly decreased arm swing on the left (same as last visit)  Total time spent on today's visit was *** minutes, including both face-to-face time and nonface-to-face time.  Time included that spent on review of records (prior notes available to me/labs/imaging if pertinent), discussing treatment and goals, answering patient's questions and coordinating care.    Cc:  Elissa Hefty, MD

## 2022-12-23 ENCOUNTER — Ambulatory Visit: Payer: Medicare Other | Admitting: Neurology

## 2022-12-23 ENCOUNTER — Encounter: Payer: Self-pay | Admitting: Neurology

## 2022-12-23 VITALS — BP 118/62 | HR 72 | Ht 70.0 in | Wt 177.0 lb

## 2022-12-23 DIAGNOSIS — G20A1 Parkinson's disease without dyskinesia, without mention of fluctuations: Secondary | ICD-10-CM

## 2022-12-23 NOTE — Patient Instructions (Signed)
As we discussed, it used to be thought that levodopa would increase risk of melanoma but now it is believed that Parkinsons itself likely increases risk of melanoma. I recommend yearly skin checks with a board certified dermatologist.  You can call [facility] or [facility] for an appointment.  [facility] Address: [address], [place], [place] [address] Phone: [phone]  [facility] [address], [place], [place] Phone: [phone]  Local and Online Resources for Power over Parkinson's Group  June 2024   LOCAL [place] PARKINSON'S GROUPS   Power over Parkinson's Group:    Power Over Parkinson's Patient Education Group will be Wednesday, JULY 10th-*PLEASE NOTE:  We will not be having a June meeting.  Our next meeting will be in July.  We apologize for the inconvenience.   Power over Parkinson's and Care Partner Groups will meet together, with plans for separate break out session for caregivers, depending on topic/speaker Upcoming Power over Parkinson's Meetings/Care Partner Support:  2nd Wednesdays of the month at 2 pm:   No regular June meeting, July 10th  Contact Amy Marriott at [email or web address] or Sarah Chambers at [email or web address] if interested in participating in this group    LOCAL EVENTS AND NEW OFFERINGS  PLEASE NOTE:  There will be no June Power over Parkinson's meeting (NO MEETING June 12th) Picnic Party for Parkinson's.  [facility] Movement Disorders Program Celebration.  June 19th 1-3 pm.  Contact Sarah Chambers at [email or web address] Parkinson's Social Game Night.  First Thursday of each month, 2:00-4:00 pm.  *Next date is June 6th*.  [facility], [place].  Contact [email or web address] if interested. Parkinson's CarePartner Group for Men is in the works, if interested email Sarah   [email or web address] ACT FITNESS Chair Yoga classes "Train and Gain", Fridays 10 am, [employer].  Contact Gina at [phone].  Community Fitness Instructor-Led Parkinson's Exercises Classes offering at [employer]!  TUESDAYS (Chair Yoga)  and Wednesdays (PWR! Moves)  1:00 pm.   Contact Christy Weaver at  [email or web address]  or [phone]  [place] PWR! Moves classes.  Thursdays at 11:45, [employer] [employer].  Free to participate through Parkinson Foundation grant.  Contact Sarah Chambers at [email or web address] or [phone] to register Drumming for Parkinson's will be held on 2nd and 4th Mondays at 11:00 am.   Located at the [employer] ([address] .)  Contact Jane Maydian at [email or web address] or [phone]  [employer] Parkinson's Tai Chi Class, Mondays at 11 am.  Call [phone] for details   ONLINE EDUCATION AND SUPPORT  Parkinson Foundation:  www.parkinson.org  PD Health at Home continues:  Mindfulness Mondays, Wellness Wednesdays, Fitness Fridays  (PWR! Moves as part of Fitness Fridays March 22nd, 1-1:45 pm) Upcoming Education:   Current and Emerging Methods to Aid Parkinson's Diagnosis.  Wednesday, May 29th, 1-2 pm Recognize and Respond to Parkinson's Psychosis.  Wednesday, June 5th, 1-2 pm Parkinsonisms.  Wednesday, June 12th, 1-2 pm Expert Briefing:  Addressing the Challenge of Apathy in Parkinson's.  Wednesday, September 11th, 1-2 pm. Register for virtual education and expert briefings (webinars) at www.[email or web address].org/resources-support/online-education Please check out their website to sign up for emails and see their full online offerings     [employer] Foundation:  www.[email or web address].org   Third Thursday Webinars:  On the third Thursday of every month at 12 p.m. ET, join our free live webinars to learn about various aspects of living with Parkinson's disease and our   work to speed  medical breakthroughs.  Upcoming Webinar:  You Want to Volunteer for Parkinson's Research: Now What?  Thursday, June 20th at 12 noon. Check out additional information on their website to see their full online offerings    Davis Phinney Foundation:  www.davisphinneyfoundation.org  Upcoming Webinar:  Living Safely with Parkinson's Inside and Outside of your Home.  Thursday, June 13th , 3 pm Series:  Living with Parkinson's Meetup.   Third Thursdays each month, 3 pm  Care Partner Monthly Meetup.  With Connie Carpenter Phinney.  First Tuesday of each month, 2 pm  Check out additional information to Live Well Today on their website    Parkinson and Movement Disorders (PMD) Alliance:  www.pmdalliance.org  NeuroLife Online:  Online Education Events  Sign up for emails, which are sent weekly to give you updates on programming and online offerings    Parkinson's Association of the [place]:  www.parkinsonassociation.org  Information on online support groups, education events, and online exercises including Yoga, Parkinson's exercises and more-LOTS of information on links to PD resources and online events  Virtual Support Group through Parkinson's Association of the [place]; next one is scheduled for Wednesday, June 5th   MOVEMENT AND EXERCISE OPPORTUNITIES  Parkinson's Exercise Class offerings at [employer]. *TUESDAYS* (Chair yoga) and Wednesdays (PWR! Moves)  1:00 pm.   *Please note that PWR! Moves [place] class has ended.  Please consider trying PWR! Moves on Wednesdays at 1 pm at [employer].  Contact Christy Weaver at [email or web address]    Parkinson's Wellness Recovery (PWR! Moves)  www.pwr4life.org  Info on the PWR! Virtual Experience:  You will have access to our expertise?through self-assessment, guided plans that start with the PD-specific fundamentals, educational content, tips, Q&A with an expert, and a growing library of PD-specific pre-recorded and live exercise classes  of varying types and intensity - both physical and cognitive! If that is not enough, we offer 1:1 wellness consultations (in-person or virtual) to personalize your PWR! Virtual Experience.   Parkinson Foundation Fitness Fridays:   As part of the PD Health @ Home program, this free video series focuses each week on one aspect of fitness designed to support people living with Parkinson's.? These weekly videos highlight the Parkinson Foundation fitness guidelines for people with Parkinson's disease.  www.parkinson.org/resources-support/online-education/pdhealth#ff  Dance for PD website is offering free, live-stream classes throughout the week, as well as links to digital library of classes:  [email or web address]  Virtual dance and Pilates for Parkinson's classes: Click on the Community Tab> Parkinson's Movement Initiative Tab.  To register for classes and for more information, visit www.[email or web address] and click the "community" tab.   [employer] Parkinson's Cycling Classes   [employer]:  Thursdays @ Noon-Live classes at [employer] (Contact Margaret Hazen at [email or web address]?or [phone])  [facility] [employer]: Classes Tuesday, Wednesday and Thursday (contact Marlee at [email or web address] ?or [phone])  [place] [employer]  Varied levels of classes are offered Tuesdays and Thursdays at [employer].   Stretching with Maria weekly class is also offered for people with Parkinson's  To observe a class or for more information, call [phone] or email Hillary Savage at [email or web address]   ADDITIONAL SUPPORT AND RESOURCES  Well-Spring [employer]:  Online Caregiver Education Opportunities:  www.well-springsolutions.org/caregiver-education/caregiver-support-group.  You may also contact Jodi Kolada at [email or web address] or [phone].     Well-Spring Navigator:  Just1Navigator program, a?free service to help individuals and  families through the journey of determining care for older adults.  The "Navigator" is a social worker, Nicole Reynolds, who   will speak with a prospective client and/or loved ones to provide an assessment of the situation and a set of recommendations for a personalized care plan -- all free of charge, and whether?Well-Spring Solutions offers the needed service or not. If the need is not a service we provide, we are well-connected with reputable programs in town that we can refer you to.  www.well-springsolutions.org or to speak with the Navigator, call [phone].     

## 2022-12-25 ENCOUNTER — Ambulatory Visit: Payer: Medicare Other | Admitting: Neurology

## 2023-04-12 ENCOUNTER — Encounter: Payer: Self-pay | Admitting: Neurology

## 2023-04-12 NOTE — Progress Notes (Unsigned)
Assessment/Plan:   1.  Parkinsons Disease with levodopa resistant tremor  -Continue pramipexole 0.5 mg 3 times per day.    -Continue carbidopa/levodopa 25/100, 1 tablet 3 times daily.  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  I have no objection to that.  I have no objection to it from a pure Parkinsons Disease standpoint  letter done today  -Understands Parkinson's slightly increases risk for melanoma.  He has had a BCC in the past but hasn't been in several years.  Discussed again to get appt as he hasn't done that.   2.  History of hypertension  -having intermittent episodes of lightheadedness.  Told him to monitor BP more closely.  Likely getting some low BP's on the antihypertensives.  Tried to go off of them a long while back without success but suspect that maybe dose could be lowered now that he has had Parkinsons Disease for longer.  F/u pcp.    Subjective:   John Boyd was seen today in follow up for Parkinsons disease.  My previous records were reviewed prior to todays visit as well as outside records available to me.   Pt with wife who supplements hx.  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  His letter is due and he presents for that.  No hypersomnolence while driving.  He may fall asleep while watching TV.  No near syncope.  Was having a bit lightheadedness but that resolved.  No compulsive behaviors.  He has prisms in his glasses that are correcting his diplopia.    Current prescribed movement disorder medications: Pramipexole, 0.5 mg 3 times per day (misses 4-5 doses per week) Carbidopa/levodopa 25/100, 1 tablet 3 times per day    PREVIOUS MEDICATIONS: Sinemet (2 po tid - didn't help tremor)  ALLERGIES:   Allergies  Allergen Reactions   Amoxicillin-Pot Clavulanate Nausea Only   Statins Other (See  Comments)   Latex Rash    CURRENT MEDICATIONS:  Outpatient Encounter Medications as of 04/13/2023  Medication Sig   allopurinol (ZYLOPRIM) 100 MG tablet Take 200 mg by mouth daily.   amoxicillin (AMOXIL) 500 MG capsule Take 4 tablet 1 hour before procedure. For dental prophylaxis only   aspirin EC 81 MG tablet Take 81 mg by mouth daily.   carbidopa-levodopa (SINEMET IR) 25-100 MG tablet Take 1 tablet by mouth 3 (three) times daily. 7am/11am/4pm   fluticasone (FLONASE) 50 MCG/ACT nasal spray Place 1 spray into the nose daily as needed. Nasal allergies   indomethacin (INDOCIN SR) 75 MG CR capsule As needed   lisinopril (ZESTRIL) 10 MG tablet Take 1 tablet by mouth daily.   loratadine (CLARITIN) 10 MG tablet Take 10 mg by mouth as needed for allergies.   pramipexole (MIRAPEX) 0.5 MG tablet Take 1 tablet (0.5 mg total) by mouth 3 (three) times daily.   No facility-administered encounter medications on file as of 04/13/2023.    Objective:   PHYSICAL EXAMINATION:    VITALS:   Vitals:   04/13/23 1116  BP: 126/84  Pulse: 65  SpO2: 99%  Weight: 176 lb 6.4 oz (80 kg)  Height: 5\' 10"  (1.778 m)     GEN:  The patient appears stated age and is in NAD. HEENT:  Normocephalic, atraumatic.  The mucous membranes are moist. The superficial temporal arteries are without ropiness or tenderness. CV:  RRR Lungs:  CTAB Neck:  no bruits   Neurological examination:  Orientation: The patient is alert and oriented x3. Cranial nerves: There is good facial symmetry with minimal facial hypomimia. The speech is fluent and clear. Soft palate rises symmetrically and there is no tongue deviation. Hearing is intact to conversational tone. Sensation: Sensation is intact to light touch throughout Motor: Strength is at least antigravity x4.  Pt seen at 11:30 - hasn't yet taken medication today  Movement examination: Tone: There is mild increased tone in the LUE Abnormal movements: there is near constant  LUE rest tremor, mild (same as prior) Coordination:  There is no decremation with RAM's, with any form of RAMS, including alternating supination and pronation of the forearm, hand opening and closing, finger taps, heel taps and toe taps. Gait and Station: The patient has no difficulty arising out of a deep-seated chair without the use of the hands. The patient's stride length is good with slightly decreased arm swing on the left (same as last visit)  Total time spent on today's visit was 30 minutes, including both face-to-face time and nonface-to-face time.  Time included that spent on review of records (prior notes available to me/labs/imaging if pertinent), discussing treatment and goals, answering patient's questions and coordinating care.    Cc:  Elissa Hefty, MD

## 2023-04-13 ENCOUNTER — Encounter: Payer: Self-pay | Admitting: Neurology

## 2023-04-13 ENCOUNTER — Ambulatory Visit (INDEPENDENT_AMBULATORY_CARE_PROVIDER_SITE_OTHER): Payer: Medicare Other | Admitting: Neurology

## 2023-04-13 VITALS — BP 126/84 | HR 65 | Ht 70.0 in | Wt 176.4 lb

## 2023-04-13 DIAGNOSIS — G20A1 Parkinson's disease without dyskinesia, without mention of fluctuations: Secondary | ICD-10-CM

## 2023-06-04 ENCOUNTER — Encounter: Payer: Self-pay | Admitting: Neurology

## 2023-06-06 ENCOUNTER — Other Ambulatory Visit: Payer: Self-pay | Admitting: Neurology

## 2023-06-06 DIAGNOSIS — G20A1 Parkinson's disease without dyskinesia, without mention of fluctuations: Secondary | ICD-10-CM

## 2023-07-01 ENCOUNTER — Ambulatory Visit: Payer: Medicare Other | Admitting: Neurology

## 2023-07-19 ENCOUNTER — Other Ambulatory Visit: Payer: Self-pay | Admitting: Neurology

## 2023-07-19 DIAGNOSIS — G20A1 Parkinson's disease without dyskinesia, without mention of fluctuations: Secondary | ICD-10-CM

## 2023-10-14 ENCOUNTER — Ambulatory Visit: Payer: Medicare Other | Admitting: Neurology

## 2023-10-17 ENCOUNTER — Other Ambulatory Visit: Payer: Self-pay | Admitting: Neurology

## 2023-10-17 DIAGNOSIS — G20A1 Parkinson's disease without dyskinesia, without mention of fluctuations: Secondary | ICD-10-CM

## 2023-10-25 NOTE — Progress Notes (Unsigned)
 Assessment/Plan:   1.  Parkinsons Disease with levodopa  resistant tremor  -Continue pramipexole  0.5 mg 3 times per day.    -Continue carbidopa /levodopa  25/100, 1 tablet 3 times daily.  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  I have no objection to that.  I have no objection to it from a pure Parkinsons Disease standpoint  letter done today  -Understands Parkinson's slightly increases risk for melanoma.  He is following with dermatology, last visit November, 2024.  2.  History of hypertension  -having intermittent episodes of lightheadedness.  Told him to monitor BP more closely.  Likely getting some low BP's on the antihypertensives.  Tried to go off of them a long while back without success but suspect that maybe dose could be lowered now that he has had Parkinsons Disease for longer.  F/u pcp.    Subjective:   John Boyd was seen today in follow up for Parkinsons disease.  My previous records were reviewed prior to todays visit as well as outside records available to me.   Pt with wife who supplements hx. he has been seen by dermatology since last visit.  That was done November, 2024.  He is taking his pramipexole  and levodopa  regularly.  He has had no compulsive behaviors.  He has no sleep attacks.  He has no syncope/near syncope. Current prescribed movement disorder medications: Pramipexole , 0.5 mg 3 times per day (misses 4-5 doses per week) Carbidopa /levodopa  25/100, 1 tablet 3 times per day    PREVIOUS MEDICATIONS: Sinemet  (2 po tid - didn't help tremor)  ALLERGIES:   Allergies  Allergen Reactions   Amoxicillin -Pot Clavulanate Nausea Only   Statins Other (See Comments)   Latex Rash    CURRENT MEDICATIONS:  Outpatient Encounter Medications as of 10/28/2023  Medication Sig   allopurinol (ZYLOPRIM) 100 MG tablet Take 200 mg by mouth daily.   amoxicillin  (AMOXIL ) 500 MG capsule Take 4 tablet 1 hour before procedure. For  dental prophylaxis only   aspirin  EC 81 MG tablet Take 81 mg by mouth daily.   carbidopa -levodopa  (SINEMET  IR) 25-100 MG tablet TAKE 1 TABLET BY MOUTH 3 (THREE) TIMES DAILY. 7AM/11AM/4PM   fluticasone (FLONASE) 50 MCG/ACT nasal spray Place 1 spray into the nose daily as needed. Nasal allergies   indomethacin (INDOCIN SR) 75 MG CR capsule As needed   lisinopril  (ZESTRIL ) 10 MG tablet Take 1 tablet by mouth daily.   loratadine (CLARITIN) 10 MG tablet Take 10 mg by mouth as needed for allergies.   pramipexole  (MIRAPEX ) 0.5 MG tablet TAKE 1 TABLET BY MOUTH 3 TIMES DAILY.   No facility-administered encounter medications on file as of 10/28/2023.    Objective:   PHYSICAL EXAMINATION:    VITALS:   There were no vitals filed for this visit.    GEN:  The patient appears stated age and is in NAD. HEENT:  Normocephalic, atraumatic.  The mucous membranes are moist. The superficial temporal arteries are without ropiness or tenderness. CV:  RRR Lungs:  CTAB Neck:  no bruits   Neurological examination:  Orientation: The patient is alert and oriented x3. Cranial nerves: There is good facial symmetry with minimal facial hypomimia. The speech is fluent and clear. Soft palate rises symmetrically and there is no tongue deviation. Hearing is intact to conversational tone. Sensation: Sensation is intact to light touch throughout Motor: Strength is at least antigravity x4.  Pt seen at 11:30 - hasn't yet taken medication  today  Movement examination: Tone: There is mild increased tone in the LUE Abnormal movements: there is near constant LUE rest tremor, mild (same as prior) Coordination:  There is no decremation with RAM's, with any form of RAMS, including alternating supination and pronation of the forearm, hand opening and closing, finger taps, heel taps and toe taps. Gait and Station: The patient has no difficulty arising out of a deep-seated chair without the use of the hands. The patient's  stride length is good with slightly decreased arm swing on the left (same as last visit)  Total time spent on today's visit was *** minutes, including both face-to-face time and nonface-to-face time.  Time included that spent on review of records (prior notes available to me/labs/imaging if pertinent), discussing treatment and goals, answering patient's questions and coordinating care.    Cc:  Enolia Hartmann, MD

## 2023-10-28 ENCOUNTER — Ambulatory Visit (INDEPENDENT_AMBULATORY_CARE_PROVIDER_SITE_OTHER): Payer: Medicare Other | Admitting: Neurology

## 2023-10-28 ENCOUNTER — Encounter: Payer: Self-pay | Admitting: Neurology

## 2023-10-28 VITALS — BP 118/76 | HR 66 | Ht 70.0 in | Wt 176.2 lb

## 2023-10-28 DIAGNOSIS — G20A1 Parkinson's disease without dyskinesia, without mention of fluctuations: Secondary | ICD-10-CM | POA: Diagnosis not present

## 2023-10-28 NOTE — Patient Instructions (Signed)

## 2024-01-13 ENCOUNTER — Encounter: Payer: Self-pay | Admitting: Neurology

## 2024-03-20 ENCOUNTER — Encounter: Payer: Self-pay | Admitting: Neurology
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
## 2024-04-04 ENCOUNTER — Encounter: Payer: Self-pay | Admitting: Neurology

## 2024-04-04 ENCOUNTER — Ambulatory Visit (INDEPENDENT_AMBULATORY_CARE_PROVIDER_SITE_OTHER): Admitting: Neurology

## 2024-04-04 VITALS — BP 126/84 | HR 72 | Wt 174.4 lb

## 2024-04-04 DIAGNOSIS — G20A1 Parkinson's disease without dyskinesia, without mention of fluctuations: Secondary | ICD-10-CM

## 2024-04-05 ENCOUNTER — Ambulatory Visit: Admitting: Neurology

## 2024-04-07 ENCOUNTER — Other Ambulatory Visit: Payer: Self-pay | Admitting: Neurology

## 2024-04-07 DIAGNOSIS — G20A1 Parkinson's disease without dyskinesia, without mention of fluctuations: Secondary | ICD-10-CM

## 2024-05-02 ENCOUNTER — Ambulatory Visit: Admitting: Neurology

## 2024-10-05 ENCOUNTER — Ambulatory Visit: Admitting: Neurology
